# Patient Record
Sex: Male | Born: 1975 | Race: White | Hispanic: No | Smoking: Current every day smoker
Health system: Southern US, Community
[De-identification: ages and names within clinical notes are randomized; demographics above are authoritative.]

## PROBLEM LIST (undated history)

## (undated) DIAGNOSIS — N289 Disorder of kidney and ureter, unspecified: Secondary | ICD-10-CM

## (undated) DIAGNOSIS — K219 Gastro-esophageal reflux disease without esophagitis: Secondary | ICD-10-CM

## (undated) DIAGNOSIS — Z87442 Personal history of urinary calculi: Secondary | ICD-10-CM

## (undated) HISTORY — PX: HAND SURGERY: SHX662

## (undated) HISTORY — PX: VASECTOMY: SHX75

## (undated) HISTORY — PX: COLONOSCOPY: SHX174

## (undated) HISTORY — PX: WRIST FRACTURE SURGERY: SHX121

---

## 1998-02-02 ENCOUNTER — Emergency Department (HOSPITAL_COMMUNITY): Admission: EM | Admit: 1998-02-02 | Discharge: 1998-02-02 | Payer: Self-pay | Admitting: Emergency Medicine

## 1998-02-14 ENCOUNTER — Emergency Department (HOSPITAL_COMMUNITY): Admission: EM | Admit: 1998-02-14 | Discharge: 1998-02-14 | Payer: Self-pay | Admitting: Emergency Medicine

## 2014-12-31 ENCOUNTER — Emergency Department (HOSPITAL_COMMUNITY)
Admission: EM | Admit: 2014-12-31 | Discharge: 2014-12-31 | Disposition: A | Discharge status: Home / Self Care | Payer: Managed Care, Other (non HMO) | Attending: Emergency Medicine | Admitting: Emergency Medicine

## 2014-12-31 ENCOUNTER — Emergency Department (HOSPITAL_COMMUNITY): Payer: Managed Care, Other (non HMO)

## 2014-12-31 ENCOUNTER — Encounter (HOSPITAL_COMMUNITY): Payer: Self-pay | Admitting: Emergency Medicine

## 2014-12-31 DIAGNOSIS — Z79899 Other long term (current) drug therapy: Secondary | ICD-10-CM | POA: Diagnosis not present

## 2014-12-31 DIAGNOSIS — M238X2 Other internal derangements of left knee: Secondary | ICD-10-CM | POA: Insufficient documentation

## 2014-12-31 DIAGNOSIS — X58XXXA Exposure to other specified factors, initial encounter: Secondary | ICD-10-CM | POA: Insufficient documentation

## 2014-12-31 DIAGNOSIS — Y9289 Other specified places as the place of occurrence of the external cause: Secondary | ICD-10-CM | POA: Insufficient documentation

## 2014-12-31 DIAGNOSIS — Y998 Other external cause status: Secondary | ICD-10-CM | POA: Insufficient documentation

## 2014-12-31 DIAGNOSIS — Y9364 Activity, baseball: Secondary | ICD-10-CM | POA: Diagnosis not present

## 2014-12-31 DIAGNOSIS — M25562 Pain in left knee: Secondary | ICD-10-CM | POA: Diagnosis present

## 2014-12-31 DIAGNOSIS — M2392 Unspecified internal derangement of left knee: Secondary | ICD-10-CM

## 2014-12-31 DIAGNOSIS — Z87891 Personal history of nicotine dependence: Secondary | ICD-10-CM | POA: Diagnosis not present

## 2014-12-31 MED ORDER — HYDROCODONE-ACETAMINOPHEN 5-325 MG PO TABS: 1.0000 | ORAL_TABLET | ORAL | Status: DC | PRN

## 2014-12-31 MED ORDER — HYDROCODONE-ACETAMINOPHEN 5-325 MG PO TABS
1.0000 | ORAL_TABLET | Freq: Once | ORAL | Status: AC
Start: 2014-12-31 — End: 2014-12-31
  Administered 2014-12-31: 1 via ORAL
  Filled 2014-12-31: qty 1

## 2014-12-31 MED ORDER — IBUPROFEN 600 MG PO TABS: 600.0000 mg | ORAL_TABLET | Freq: Four times a day (QID) | ORAL | Status: DC | PRN

## 2014-12-31 NOTE — Discharge Instructions (Signed)
Knee Pain °The knee is the complex joint between your thigh and your lower leg. It is made up of bones, tendons, ligaments, and cartilage. The bones that make up the knee are: °· The femur in the thigh. °· The tibia and fibula in the lower leg. °· The patella or kneecap riding in the groove on the lower femur. °CAUSES  °Knee pain is a common complaint with many causes. A few of these causes are: °· Injury, such as: °¨ A ruptured ligament or tendon injury. °¨ Torn cartilage. °· Medical conditions, such as: °¨ Gout °¨ Arthritis °¨ Infections °· Overuse, over training, or overdoing a physical activity. °Knee pain can be minor or severe. Knee pain can accompany debilitating injury. Minor knee problems often respond well to self-care measures or get well on their own. More serious injuries may need medical intervention or even surgery. °SYMPTOMS °The knee is complex. Symptoms of knee problems can vary widely. Some of the problems are: °· Pain with movement and weight bearing. °· Swelling and tenderness. °· Buckling of the knee. °· Inability to straighten or extend your knee. °· Your knee locks and you cannot straighten it. °· Warmth and redness with pain and fever. °· Deformity or dislocation of the kneecap. °DIAGNOSIS  °Determining what is wrong may be very straight forward such as when there is an injury. It can also be challenging because of the complexity of the knee. Tests to make a diagnosis may include: °· Your caregiver taking a history and doing a physical exam. °· Routine X-rays can be used to rule out other problems. X-rays will not reveal a cartilage tear. Some injuries of the knee can be diagnosed by: °¨ Arthroscopy a surgical technique by which a small video camera is inserted through tiny incisions on the sides of the knee. This procedure is used to examine and repair internal knee joint problems. Tiny instruments can be used during arthroscopy to repair the torn knee cartilage (meniscus). °¨ Arthrography  is a radiology technique. A contrast liquid is directly injected into the knee joint. Internal structures of the knee joint then become visible on X-ray film. °¨ An MRI scan is a non X-ray radiology procedure in which magnetic fields and a computer produce two- or three-dimensional images of the inside of the knee. Cartilage tears are often visible using an MRI scanner. MRI scans have largely replaced arthrography in diagnosing cartilage tears of the knee. °· Blood work. °· Examination of the fluid that helps to lubricate the knee joint (synovial fluid). This is done by taking a sample out using a needle and a syringe. °TREATMENT °The treatment of knee problems depends on the cause. Some of these treatments are: °· Depending on the injury, proper casting, splinting, surgery, or physical therapy care will be needed. °· Give yourself adequate recovery time. Do not overuse your joints. If you begin to get sore during workout routines, back off. Slow down or do fewer repetitions. °· For repetitive activities such as cycling or running, maintain your strength and nutrition. °· Alternate muscle groups. For example, if you are a weight lifter, work the upper body on one day and the lower body the next. °· Either tight or weak muscles do not give the proper support for your knee. Tight or weak muscles do not absorb the stress placed on the knee joint. Keep the muscles surrounding the knee strong. °· Take care of mechanical problems. °¨ If you have flat feet, orthotics or special shoes may help.   See your caregiver if you need help. °¨ Arch supports, sometimes with wedges on the inner or outer aspect of the heel, can help. These can shift pressure away from the side of the knee most bothered by osteoarthritis. °¨ A brace called an "unloader" brace also may be used to help ease the pressure on the most arthritic side of the knee. °· If your caregiver has prescribed crutches, braces, wraps or ice, use as directed. The acronym  for this is PRICE. This means protection, rest, ice, compression, and elevation. °· Nonsteroidal anti-inflammatory drugs (NSAIDs), can help relieve pain. But if taken immediately after an injury, they may actually increase swelling. Take NSAIDs with food in your stomach. Stop them if you develop stomach problems. Do not take these if you have a history of ulcers, stomach pain, or bleeding from the bowel. Do not take without your caregiver's approval if you have problems with fluid retention, heart failure, or kidney problems. °· For ongoing knee problems, physical therapy may be helpful. °· Glucosamine and chondroitin are over-the-counter dietary supplements. Both may help relieve the pain of osteoarthritis in the knee. These medicines are different from the usual anti-inflammatory drugs. Glucosamine may decrease the rate of cartilage destruction. °· Injections of a corticosteroid drug into your knee joint may help reduce the symptoms of an arthritis flare-up. They may provide pain relief that lasts a few months. You may have to wait a few months between injections. The injections do have a small increased risk of infection, water retention, and elevated blood sugar levels. °· Hyaluronic acid injected into damaged joints may ease pain and provide lubrication. These injections may work by reducing inflammation. A series of shots may give relief for as long as 6 months. °· Topical painkillers. Applying certain ointments to your skin may help relieve the pain and stiffness of osteoarthritis. Ask your pharmacist for suggestions. Many over the-counter products are approved for temporary relief of arthritis pain. °· In some countries, doctors often prescribe topical NSAIDs for relief of chronic conditions such as arthritis and tendinitis. A review of treatment with NSAID creams found that they worked as well as oral medications but without the serious side effects. °PREVENTION °· Maintain a healthy weight. Extra pounds  put more strain on your joints. °· Get strong, stay limber. Weak muscles are a common cause of knee injuries. Stretching is important. Include flexibility exercises in your workouts. °· Be smart about exercise. If you have osteoarthritis, chronic knee pain or recurring injuries, you may need to change the way you exercise. This does not mean you have to stop being active. If your knees ache after jogging or playing basketball, consider switching to swimming, water aerobics, or other low-impact activities, at least for a few days a week. Sometimes limiting high-impact activities will provide relief. °· Make sure your shoes fit well. Choose footwear that is right for your sport. °· Protect your knees. Use the proper gear for knee-sensitive activities. Use kneepads when playing volleyball or laying carpet. Buckle your seat belt every time you drive. Most shattered kneecaps occur in car accidents. °· Rest when you are tired. °SEEK MEDICAL CARE IF:  °You have knee pain that is continual and does not seem to be getting better.  °SEEK IMMEDIATE MEDICAL CARE IF:  °Your knee joint feels hot to the touch and you have a high fever. °MAKE SURE YOU:  °· Understand these instructions. °· Will watch your condition. °· Will get help right away if you are not   doing well or get worse. Document Released: 06/16/2007 Document Revised: 11/11/2011 Document Reviewed: 06/16/2007 [facility] Patient Information 2015 [place], [place]. This information is not intended to replace advice given to you by your health care provider. Make sure you discuss any questions you have with your health care provider.   As discussed,  I suspect you may have an anterior cruciate ligament injury, but possibly not a complete tear as your knee joint appears stable at this time.  You do need to see your orthopedist for further evaluation of this injury.  Use crutches and the knee immobilizer to protect your knee.  Ice and elevate as much as possible for the next  several days to help with pain and swelling.  You may take the hydrocodone prescribed for pain relief.  This will make you drowsy - do not drive within 4 hours of taking this medication.

## 2014-12-31 NOTE — ED Notes (Signed)
Patient complaining of left knee pain. States knee buckled twice while playing softball. States is able to straighten knee out, but hurts to bear weight.

## 2015-01-01 NOTE — ED Provider Notes (Signed)
CSN: [id number]     Arrival date & time 12/31/14  2048 History   First MD Initiated Contact with Patient 12/31/14 2111     Chief Complaint  Patient presents with  . Knee Injury     (Consider location/radiation/quality/duration/timing/severity/associated sxs/prior Treatment) The history is provided by the patient and the spouse.   TARQUIN WELCHER is a 39 y.o. male presenting with left knee pain which started while playing softball around noon today. He describes planting his left foot to throw the ball and when he pivoted felt a sudden sharp pain in the knee joint causing him to fall.  Since he has been minimizing weight bearing and using ice without significant improvement in the pain which is worsened with movement and weight bearing.  Denies prior injury to this knee.  He is actively being followed by an orthopedist in [place] for a right hand surgical metacarpal repair.     History reviewed. No pertinent past medical history. Past Surgical History  Procedure Laterality Date  . Hand surgery     History reviewed. No pertinent family history. History  Substance Use Topics  . Smoking status: Former Games developer  . Smokeless tobacco: Not on file  . Alcohol Use: Yes     Comment: occasional    Review of Systems  Constitutional: Negative for fever.  Musculoskeletal: Positive for joint swelling and arthralgias. Negative for myalgias.  Neurological: Negative for weakness and numbness.      Allergies  Review of patient's allergies indicates no known allergies.  Home Medications   Prior to Admission medications   Medication Sig Start Date End Date Taking? Authorizing Provider  L-LYSINE PO Take 500 mg by mouth 2 (two) times daily.   Yes Historical Provider, MD  HYDROcodone-acetaminophen (NORCO/VICODIN) 5-325 MG per tablet Take 1 tablet by mouth every 4 (four) hours as needed. 12/31/14   Burgess Amor, PA-C  ibuprofen (ADVIL,MOTRIN) 600 MG tablet Take 1 tablet (600 mg total) by mouth  every 6 (six) hours as needed. 12/31/14   Burgess Amor, PA-C   BP 121/71 mmHg  Pulse 77  Temp(Src) 98 F (36.7 C) (Oral)  Resp 20  Ht 6' (1.829 m)  Wt 225 lb (102.059 kg)  BMI 30.51 kg/m2  SpO2 94% Physical Exam  Constitutional: He appears well-developed and well-nourished.  HENT:  Head: Atraumatic.  Neck: Normal range of motion.  Cardiovascular:  Pulses equal bilaterally  Musculoskeletal: He exhibits tenderness.       Left knee: He exhibits swelling and bony tenderness. He exhibits no effusion, no ecchymosis, no deformity, no erythema, normal alignment, no LCL laxity and no MCL laxity.  ttp anterior medial and lateral  joint space along tibial meniscus.  Negative anterior/posterior drawer tests, although pain increased with anterior drawer.  Neurological: He is alert. He has normal strength. He displays normal reflexes. No sensory deficit.  Skin: Skin is warm and dry.  Psychiatric: He has a normal mood and affect.    ED Course  Procedures (including critical care time) Labs Review Labs Reviewed - No data to display  Imaging Review Dg Knee Complete 4 Views Left  12/31/2014   CLINICAL DATA:  Acute onset of left knee pain. Knee buckled twice while playing softball. Initial encounter.  EXAM: LEFT KNEE - COMPLETE 4+ VIEW  COMPARISON:  None.  FINDINGS: There is question of a tiny osseous fragment overlying the tibial spine. Would correlate as to whether the patient has evidence of anterior cruciate ligament avulsion injury.  There is no  additional evidence for fracture. The joint spaces are otherwise preserved. No significant degenerative change is seen; the patellofemoral joint is grossly unremarkable in appearance.  No significant joint effusion is seen. The visualized soft tissues are normal in appearance.  IMPRESSION: Question of tiny osseous fragment overlying the tibial spine. Would correlate as to whether the patient has evidence of anterior cruciate ligament avulsion injury. If  instability persists, MRI may be considered for further evaluation, to assess for internal derangement.   Electronically Signed   By: Roanna RaiderJeffery  Chang M.D.   On: 12/31/2014 21:35     EKG Interpretation None      MDM   Final diagnoses:  Knee internal derangement, left    Patients labs and/or radiological studies were reviewed and considered during the medical decision making and disposition process.  Results were also discussed with patient. Pt may have sprain/partial tear to ACL, negative drawer testing.  He was placed in knee immobilizer, crutches given. RICE, ibuprofen, hydrocodone. Pt to f/u with his orthopedist this week for recheck/further testing as indicated.  He was given xrays on cd for his orthopedist.    Burgess AmorJulie Emillia Weatherly, PA-C 01/01/15 1250  Donnetta HutchingBrian Cook, MD 01/01/15 1517

## 2015-01-03 ENCOUNTER — Ambulatory Visit (INDEPENDENT_AMBULATORY_CARE_PROVIDER_SITE_OTHER): Payer: Managed Care, Other (non HMO) | Admitting: Orthopedic Surgery

## 2015-01-03 ENCOUNTER — Encounter: Payer: Self-pay | Admitting: Orthopedic Surgery

## 2015-01-03 VITALS — BP 141/84 | Ht 72.0 in | Wt 225.0 lb

## 2015-01-03 DIAGNOSIS — S83242A Other tear of medial meniscus, current injury, left knee, initial encounter: Secondary | ICD-10-CM | POA: Diagnosis not present

## 2015-01-03 DIAGNOSIS — S83512A Sprain of anterior cruciate ligament of left knee, initial encounter: Secondary | ICD-10-CM

## 2015-01-03 MED ORDER — HYDROCODONE-ACETAMINOPHEN 5-325 MG PO TABS: 1.0000 | ORAL_TABLET | Freq: Four times a day (QID) | ORAL | Status: DC | PRN

## 2015-01-03 MED ORDER — NABUMETONE 500 MG PO TABS: 500.0000 mg | ORAL_TABLET | Freq: Two times a day (BID) | ORAL | Status: DC

## 2015-01-03 NOTE — Patient Instructions (Signed)
We will schedule MRI for you and call you with results 

## 2015-01-03 NOTE — Progress Notes (Signed)
New patient  Patient ID: Adam Norris, male   DOB: [birth year], 39 y.o.   MRN: [id number]  Chief Complaint  Patient presents with  . Knee Pain    er follow up left knee pain, DOI 12/31/14     Data Reviewed The ER records and [facility] were [facility]. The patient's 4 views of his knee were read as no acute fracture but possible avulsion fragment in the central part of the joint I agree with that report after my independent review. ER records were reviewed and the patient confirms what was said at that time basically he injured his knee playing softball and had trouble walking the knee buckled on him.  Assessment New problem further workup planned Encounter Diagnoses  Name Primary?  . ACL tear, left, initial encounter Yes  . Acute medial meniscus tear of left knee, initial encounter    Plan Recommend MRI left knee to evaluate his anterior cruciate ligament and medial meniscus Start Relafen 500 mg twice a day to control his pain and swelling Continue prescription written for hydrocodone 5 mg every 6 when necessary Remove knee immobilizer switched to economy hinged brace wrap on style    Adam SciaraJoseph B Norris is a 39 y.o. male.   HPI 39 year old male was playing softball went to throw the ball planted his left foot something gave in his knee when he went to put his foot down again he collapsed as the knee would not support weight. He went to the ER x-rays were negative. He complains of pain swelling left knee primarily medial joint line with decreased range of motion, pain is now constant and worsened by weightbearing and flexion Review of Systems 14 point system review normal   History reviewed. No pertinent past medical history.  Past Surgical History  Procedure Laterality Date  . Hand surgery      History reviewed. No pertinent family history.  Social History History  Substance Use Topics  . Smoking status: Former Games developermoker  . Smokeless tobacco: Not on file   . Alcohol Use: Yes     Comment: occasional    No Known Allergies  Current Outpatient Prescriptions  Medication Sig Dispense Refill  . HYDROcodone-acetaminophen (NORCO/VICODIN) 5-325 MG per tablet Take 1 tablet by mouth every 4 (four) hours as needed. 20 tablet 0  . ibuprofen (ADVIL,MOTRIN) 600 MG tablet Take 1 tablet (600 mg total) by mouth every 6 (six) hours as needed. 30 tablet 0  . L-LYSINE PO Take 500 mg by mouth 2 (two) times daily.    Marland Kitchen. HYDROcodone-acetaminophen (NORCO/VICODIN) 5-325 MG per tablet Take 1 tablet by mouth every 6 (six) hours as needed for moderate pain. 30 tablet 0  . nabumetone (RELAFEN) 500 MG tablet Take 1 tablet (500 mg total) by mouth 2 (two) times daily. 60 tablet 5   No current facility-administered medications for this visit.       Physical Exam Blood pressure 141/84, height 6' (1.829 m), weight 225 lb (102.059 kg). Physical Exam  Musculoskeletal:       Right knee: He exhibits no effusion.       Left knee: He exhibits effusion.   The patient is well developed well nourished and well groomed. Orientation to person place and time is normal  Mood is pleasant. Ambulatory status  , ambulating with a crutch and a straight leg knee immobilizer His upper extremities look normal he's able to ambulate with crutches there is no evidence of malalignment joint contracture subluxation  atrophy. Skin normal. 2+ radial pulses bilaterally. Right Knee Exam  Right knee exam is normal.  Tenderness  The patient is experiencing no tenderness.     Range of Motion  The patient has normal right knee ROM.  Muscle Strength   The patient has normal right knee strength.  Tests  McMurray:  Medial - negative Lateral - negative Lachman:  Anterior - negative    Posterior - negative Drawer:       Posterior - negative Varus: negative Valgus: negative Patellar Apprehension: negative  Other  Erythema: absent Scars: absent Sensation: normal Pulse: present Swelling:  none Other tests: no effusion present  Comments:  Motor exam is normal   Left Knee Exam   Tenderness  The patient is experiencing tenderness in the medial joint line.  Range of Motion  Extension: 10  Flexion: 90   Tests  McMurray:  Medial - positive  Lachman:  Anterior - trace     Drawer:       Posterior - negative Patellar Apprehension: negative  Other  Erythema: absent Scars: absent Sensation: normal Pulse: present Swelling: moderate Effusion: effusion present  Comments:  Motor exam is normal

## 2015-01-16 ENCOUNTER — Other Ambulatory Visit: Payer: Self-pay | Admitting: *Deleted

## 2015-01-16 ENCOUNTER — Telehealth: Payer: Self-pay | Admitting: Orthopedic Surgery

## 2015-01-16 MED ORDER — HYDROCODONE-ACETAMINOPHEN 5-325 MG PO TABS: 1.0000 | ORAL_TABLET | Freq: Four times a day (QID) | ORAL | Status: DC | PRN

## 2015-01-16 NOTE — Telephone Encounter (Signed)
Patient is calling requesting a refill on pain medication HYDROcodone-acetaminophen (NORCO/VICODIN) 5-325 MG per tablet he states that his MRI is scheduled for Tuesday May 17 th at 6:45, please advise?

## 2015-01-16 NOTE — Telephone Encounter (Signed)
Prescription available, called patient, no answer 

## 2015-01-17 ENCOUNTER — Other Ambulatory Visit: Payer: Self-pay | Admitting: Orthopedic Surgery

## 2015-01-17 ENCOUNTER — Ambulatory Visit (HOSPITAL_COMMUNITY)
Admission: RE | Admit: 2015-01-17 | Discharge: 2015-01-17 | Disposition: A | Discharge status: Home / Self Care | Payer: Managed Care, Other (non HMO) | Source: Ambulatory Visit | Attending: Orthopedic Surgery | Admitting: Orthopedic Surgery

## 2015-01-17 DIAGNOSIS — M25562 Pain in left knee: Secondary | ICD-10-CM | POA: Insufficient documentation

## 2015-01-17 DIAGNOSIS — X58XXXA Exposure to other specified factors, initial encounter: Secondary | ICD-10-CM | POA: Insufficient documentation

## 2015-01-17 DIAGNOSIS — S72422A Displaced fracture of lateral condyle of left femur, initial encounter for closed fracture: Secondary | ICD-10-CM | POA: Insufficient documentation

## 2015-01-17 DIAGNOSIS — S83512A Sprain of anterior cruciate ligament of left knee, initial encounter: Secondary | ICD-10-CM

## 2015-01-17 DIAGNOSIS — Y9364 Activity, baseball: Secondary | ICD-10-CM | POA: Insufficient documentation

## 2015-01-17 DIAGNOSIS — Y929 Unspecified place or not applicable: Secondary | ICD-10-CM | POA: Insufficient documentation

## 2015-01-17 DIAGNOSIS — S82142A Displaced bicondylar fracture of left tibia, initial encounter for closed fracture: Secondary | ICD-10-CM | POA: Insufficient documentation

## 2015-01-18 ENCOUNTER — Telehealth: Payer: Self-pay | Admitting: Orthopedic Surgery

## 2015-01-18 NOTE — Telephone Encounter (Signed)
-----   Message from Vickki HearingStanley E Shayaan Parke, MD sent at 01/18/2015 11:15 AM EDT ----- results

## 2015-01-19 NOTE — Telephone Encounter (Signed)
Called results to him [phone]

## 2015-03-02 ENCOUNTER — Ambulatory Visit (INDEPENDENT_AMBULATORY_CARE_PROVIDER_SITE_OTHER): Payer: BLUE CROSS/BLUE SHIELD | Admitting: Orthopedic Surgery

## 2015-03-02 ENCOUNTER — Encounter: Payer: Self-pay | Admitting: Orthopedic Surgery

## 2015-03-02 ENCOUNTER — Ambulatory Visit (INDEPENDENT_AMBULATORY_CARE_PROVIDER_SITE_OTHER): Payer: BLUE CROSS/BLUE SHIELD

## 2015-03-02 VITALS — BP 118/71 | Ht 71.0 in | Wt 228.0 lb

## 2015-03-02 DIAGNOSIS — S82142D Displaced bicondylar fracture of left tibia, subsequent encounter for closed fracture with routine healing: Secondary | ICD-10-CM

## 2015-03-02 DIAGNOSIS — S82143A Displaced bicondylar fracture of unspecified tibia, initial encounter for closed fracture: Secondary | ICD-10-CM | POA: Insufficient documentation

## 2015-03-02 NOTE — Patient Instructions (Signed)
Continue brace until pain free

## 2015-03-02 NOTE — Progress Notes (Signed)
Patient ID: Adam Norris, male   DOB: [birth year], 39 y.o.   MRN: [id number]  Follow up visit  Chief Complaint  Patient presents with  . Follow-up    3 week follow up left tibial plateau fracture, DOI 12/31/14    BP 118/71 mmHg  Ht 5\' 11"  (1.803 m)  Wt 228 lb (103.42 kg)  BMI 31.81 kg/m2  Encounter Diagnosis  Name Primary?  . Tibial plateau fracture, left, closed, with routine healing, subsequent encounter Yes    Today the patient has just mild discomfort. He is wearing his hinged knee brace. He's been able to walk without too much of a problem maybe an occasional twinge of pain  Review of systems no catching locking giving way or mechanical symptoms in the knee  He has no tenderness in the knee except just right at the edge of the tibial plateau he has full range of motion without effusion. His anterior cruciate ligament was intact and stable. Collateral ligaments are normal. Motor exam is normal and his skin and sensory exam are unremarkable  The x-rays today that I ordered show no evidence of fracture  The MRI was reviewed again and it shows impaction fractures  He can resume normal activities and wear his brace until he is completely pain-free and no follow-up has been scheduled

## 2015-11-03 ENCOUNTER — Emergency Department (HOSPITAL_COMMUNITY)
Admission: EM | Admit: 2015-11-03 | Discharge: 2015-11-03 | Disposition: A | Discharge status: Home / Self Care | Payer: BLUE CROSS/BLUE SHIELD | Attending: Emergency Medicine | Admitting: Emergency Medicine

## 2015-11-03 ENCOUNTER — Encounter (HOSPITAL_COMMUNITY): Payer: Self-pay | Admitting: Emergency Medicine

## 2015-11-03 ENCOUNTER — Emergency Department (HOSPITAL_COMMUNITY): Payer: BLUE CROSS/BLUE SHIELD

## 2015-11-03 DIAGNOSIS — R202 Paresthesia of skin: Secondary | ICD-10-CM

## 2015-11-03 DIAGNOSIS — M79652 Pain in left thigh: Secondary | ICD-10-CM | POA: Diagnosis present

## 2015-11-03 DIAGNOSIS — Z87442 Personal history of urinary calculi: Secondary | ICD-10-CM | POA: Diagnosis not present

## 2015-11-03 DIAGNOSIS — Z87891 Personal history of nicotine dependence: Secondary | ICD-10-CM | POA: Insufficient documentation

## 2015-11-03 DIAGNOSIS — Z79899 Other long term (current) drug therapy: Secondary | ICD-10-CM | POA: Insufficient documentation

## 2015-11-03 DIAGNOSIS — Z8781 Personal history of (healed) traumatic fracture: Secondary | ICD-10-CM | POA: Insufficient documentation

## 2015-11-03 HISTORY — DX: Disorder of kidney and ureter, unspecified: N28.9

## 2015-11-03 MED ORDER — DEXAMETHASONE 4 MG PO TABS: 4.0000 mg | ORAL_TABLET | Freq: Two times a day (BID) | ORAL | Status: DC

## 2015-11-03 MED ORDER — HYDROCODONE-ACETAMINOPHEN 5-325 MG PO TABS: 1.0000 | ORAL_TABLET | ORAL | Status: DC | PRN

## 2015-11-03 MED ORDER — DICLOFENAC SODIUM 75 MG PO TBEC: 75.0000 mg | DELAYED_RELEASE_TABLET | Freq: Two times a day (BID) | ORAL | Status: DC

## 2015-11-03 NOTE — ED Provider Notes (Signed)
Medical screening examination/treatment/procedure(s) were performed by non-physician practitioner and as supervising physician I was immediately available for consultation/collaboration.   EKG Interpretation None       Vanetta MuldersScott Eliannah Hinde, MD 11/03/15 2031

## 2015-11-03 NOTE — Discharge Instructions (Signed)
The x-ray of your pelvis and the x-ray of your lower back are nonacute. Please see Dr. Luiz BlareGraves for orthopedic evaluation concerning the pain and numbness involving her thigh. Please use Decadron and diclofenac on 2 times daily with food. May use Norco for more severe pain. Norco may cause drowsiness, please use this medication with caution. Paresthesia Paresthesia is a burning or prickling feeling. This feeling can happen in any part of the body. It often happens in the hands, arms, legs, or feet. Usually, it is not painful. In most cases, the feeling goes away in a short time and is not a sign of a serious problem. HOME CARE  Avoid drinking alcohol.  Try massage or needle therapy (acupuncture) to help with your problems.  Keep all follow-up visits as told by your doctor. This is important. GET HELP IF:  You keep on having episodes of paresthesia.  Your burning or prickling feeling gets worse when you walk.  You have pain or cramps.  You feel dizzy.  You have a rash. GET HELP RIGHT AWAY IF:  You feel weak.  You have trouble walking or moving.  You have problems speaking, understanding, or seeing.  You feel confused.  You cannot control when you pee (urinate) or poop (bowel movement).  You lose feeling (numbness) after an injury.  You pass out (faint).   This information is not intended to replace advice given to you by your health care provider. Make sure you discuss any questions you have with your health care provider.   Document Released: 08/01/2008 Document Revised: 01/03/2015 Document Reviewed: 08/15/2014 Elsevier Interactive Patient Education [employer].

## 2015-11-03 NOTE — ED Notes (Signed)
Pt reports on-going upper LT leg pain that has progressively increased over the past few months. Pt also reports intermittent back pain. Denies urinary issues or stool incontinence. Pt had a LT femur fracture last year. Pt ambulatory.

## 2015-11-03 NOTE — ED Notes (Signed)
Pt verbalized understanding of no driving and to use caution within 4 hours of taking pain meds due to meds cause drowsiness 

## 2015-11-03 NOTE — ED Provider Notes (Signed)
CSN: [id number]     Arrival date & time 11/03/15  1829 History   First MD Initiated Contact with Patient 11/03/15 1843     Chief Complaint  Patient presents with  . Leg Pain     (Consider location/radiation/quality/duration/timing/severity/associated sxs/prior Treatment) HPI Comments: Patient is a 40 year old male who presents to the emergency department with complaint of left thigh pain. The patient states that over the last 1-1/2-2 months he's been having increasing problems with pain in the left thigh area. He states that it started as a numbness or tingling, and now he has pain. The pain is aggravated by change of position. But once he gets in a position the pain sometimes improves. He denies any recent injury or trauma to the area. He has not done any excessive lifting, pushing, or pulling. He denies carrying a cell phone heavy belts or any other objects and is left pockets. He has not had any recent viral illnesses, and has not noticed any rash in this area. It is of note that last year he sustained a nondisplaced fracture of the left femur. Several years ago he fell approximately 30 feet and injured ribs, upper extremities. No other injuries reported at this time. The patient denies any significant back pain. He's not had any loss of bowel or bladder function. He's not had any noted foot drop. He does not have any symptoms related to the right lower extremity.   Patient is a 40 y.o. male presenting with leg pain. The history is provided by the patient.  Leg Pain   Past Medical History  Diagnosis Date  . Renal disorder     kidney stone   Past Surgical History  Procedure Laterality Date  . Hand surgery    . Wrist fracture surgery    . Vasectomy    . Colonoscopy     No family history on file. Social History  Substance Use Topics  . Smoking status: Former Games developermoker  . Smokeless tobacco: None  . Alcohol Use: Yes     Comment: occasional    Review of Systems  Musculoskeletal:  Positive for arthralgias.  Neurological: Positive for numbness.  All other systems reviewed and are negative.     Allergies  Review of patient's allergies indicates no known allergies.  Home Medications   Prior to Admission medications   Medication Sig Start Date End Date Taking? Authorizing Provider  HYDROcodone-acetaminophen (NORCO/VICODIN) 5-325 MG per tablet Take 1 tablet by mouth every 4 (four) hours as needed. Patient not taking: Reported on 03/02/2015 12/31/14   Burgess AmorJulie Idol, PA-C  HYDROcodone-acetaminophen (NORCO/VICODIN) 5-325 MG per tablet Take 1 tablet by mouth every 6 (six) hours as needed for moderate pain. 01/16/15   Vickki HearingStanley E Harrison, MD  ibuprofen (ADVIL,MOTRIN) 600 MG tablet Take 1 tablet (600 mg total) by mouth every 6 (six) hours as needed. 12/31/14   Burgess AmorJulie Idol, PA-C  L-LYSINE PO Take 500 mg by mouth 2 (two) times daily.    Historical Provider, MD  nabumetone (RELAFEN) 500 MG tablet Take 1 tablet (500 mg total) by mouth 2 (two) times daily. Patient not taking: Reported on 03/02/2015 01/03/15   Vickki HearingStanley E Harrison, MD   BP 147/89 mmHg  Pulse 99  Temp(Src) 98.4 F (36.9 C) (Oral)  Resp 20  Ht 5\' 8"  (1.727 m)  Wt 95.255 kg  BMI 31.94 kg/m2  SpO2 99% Physical Exam  Constitutional: He is oriented to person, place, and time. He appears well-developed and well-nourished.  Non-toxic appearance.  HENT:  Head: Normocephalic.  Right Ear: Tympanic membrane and external ear normal.  Left Ear: Tympanic membrane and external ear normal.  Eyes: EOM and lids are normal. Pupils are equal, round, and reactive to light.  Neck: Normal range of motion. Neck supple. Carotid bruit is not present.  Cardiovascular: Normal rate, regular rhythm, normal heart sounds, intact distal pulses and normal pulses.   Pulmonary/Chest: Breath sounds normal. No respiratory distress.  Abdominal: Soft. Bowel sounds are normal. There is no tenderness. There is no guarding.  Musculoskeletal: Normal range of  motion.       Lumbar back: He exhibits normal range of motion, no tenderness, no bony tenderness and no deformity.       Left upper leg: He exhibits tenderness. He exhibits no swelling, no edema and no deformity.       Legs: No pain with rocking the pelvis. Pain improved with lying on the left side.  Lymphadenopathy:       Head (right side): No submandibular adenopathy present.       Head (left side): No submandibular adenopathy present.    He has no cervical adenopathy.  Neurological: He is alert and oriented to person, place, and time. He has normal strength. No cranial nerve deficit or sensory deficit.  Skin: Skin is warm and dry.  Psychiatric: He has a normal mood and affect. His speech is normal.  Nursing note and vitals reviewed.   ED Course  Procedures (including critical care time) Labs Review Labs Reviewed - No data to display  Imaging Review Dg Lumbar Spine Complete  11/03/2015  CLINICAL DATA:  Left back and thigh pain tingling for several months. No known injury. EXAM: LUMBAR SPINE - COMPLETE 4+ VIEW COMPARISON:  None. FINDINGS: There is no evidence of lumbar spine fracture. Alignment is normal. Intervertebral disc spaces are maintained. Probable right L5 pars defect noted although no definite pars defects seen on the left posterior oblique view. No other bone lesions identified. IMPRESSION: No acute findings.  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 Electronically Signed   By: Myles Rosenthal M.D.   On: 11/03/2015 19:34   Dg Pelvis 1-2 Views  11/03/2015  CLINICAL DATA:  Left lateral thigh pain with tingling. EXAM: PELVIS - 1-2 VIEW COMPARISON:  None. FINDINGS: There is no evidence of pelvic fracture or diastasis. No pelvic bone lesions are seen. IMPRESSION: Negative. Electronically Signed   By: Amie Portland M.D.   On: 11/03/2015 19:34   I have personally reviewed and evaluated these images and lab results as part of my medical decision-making.   EKG Interpretation None       MDM  The patient presents to the emergency department with pain and tingling with increased sensitivity of the left upper and lateral thigh. X-ray of the lumbar spine is negative, with exception of probable unilateral right L5 pars defect. X-ray of the pelvis is negative for fracture or dislocation involving the pelvic bones. Patient will be referred to orthopedics for evaluation of possible femoral nerve irritation, and or entrapment. Prescription for Decadron and Norco given to the patient. Patient is referred to Dr. Luiz Blare for orthopedic evaluation.    Final diagnoses:  None    **I have reviewed nursing notes, vital signs, and all appropriate lab and imaging results for this patient.Ivery Quale, PA-C 11/03/15 1950  Ivery Quale, PA-C 11/04/15 [phone]

## 2016-08-17 IMAGING — DX DG KNEE COMPLETE 4+V*L*
4 series · 4 of 4 positions shown · non-contrast
Comparison: None.

CLINICAL DATA: Acute onset of left knee pain. Knee buckled twice
while playing softball. Initial encounter.

EXAM:
LEFT KNEE - COMPLETE 4+ VIEW

[knee ap]
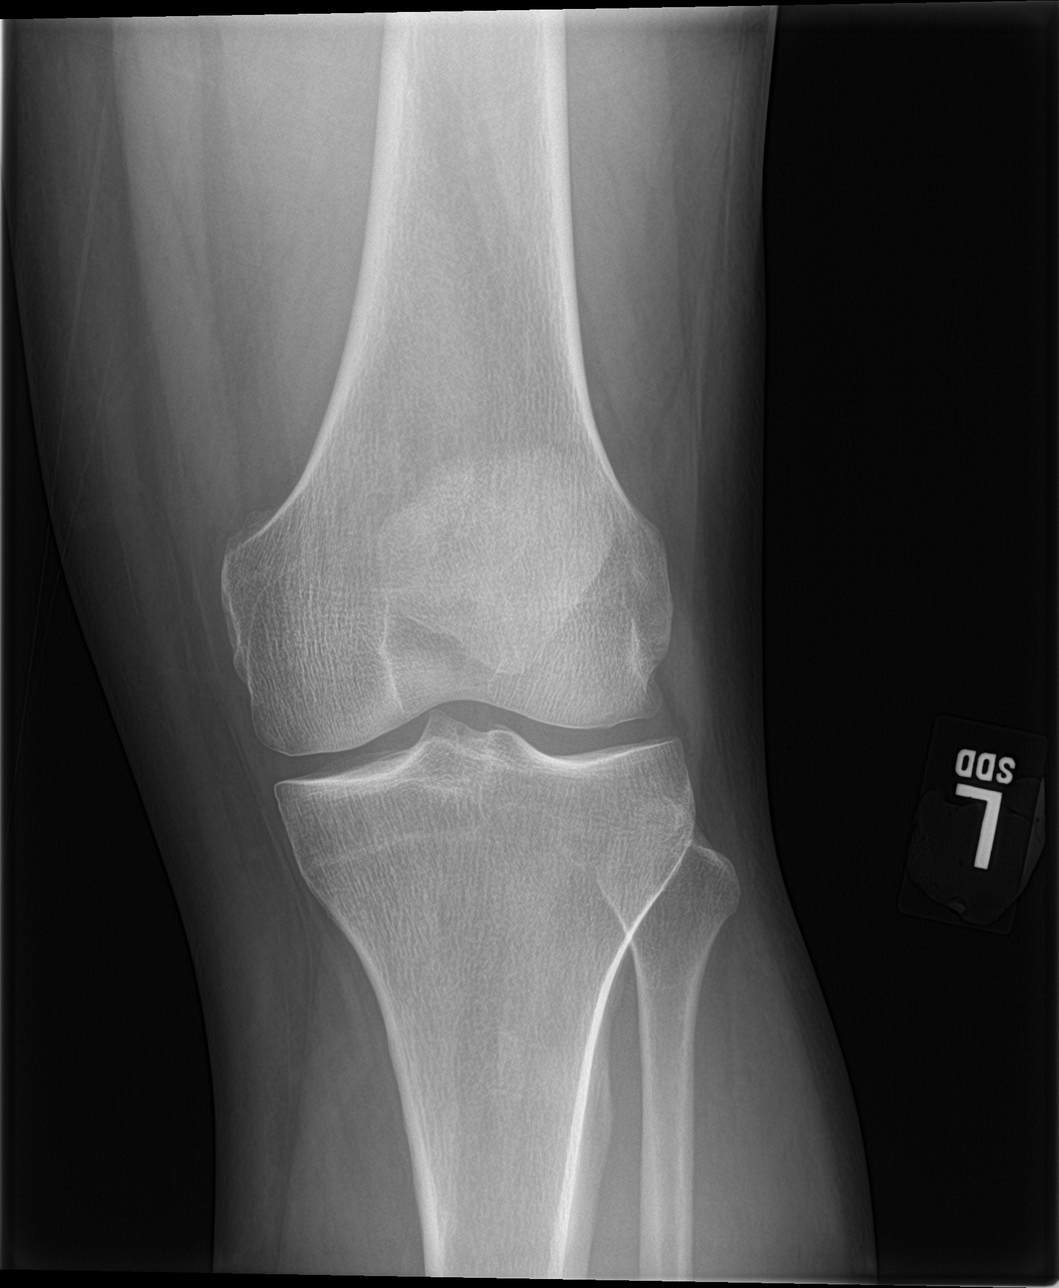

[knee obl (1 of 2)]
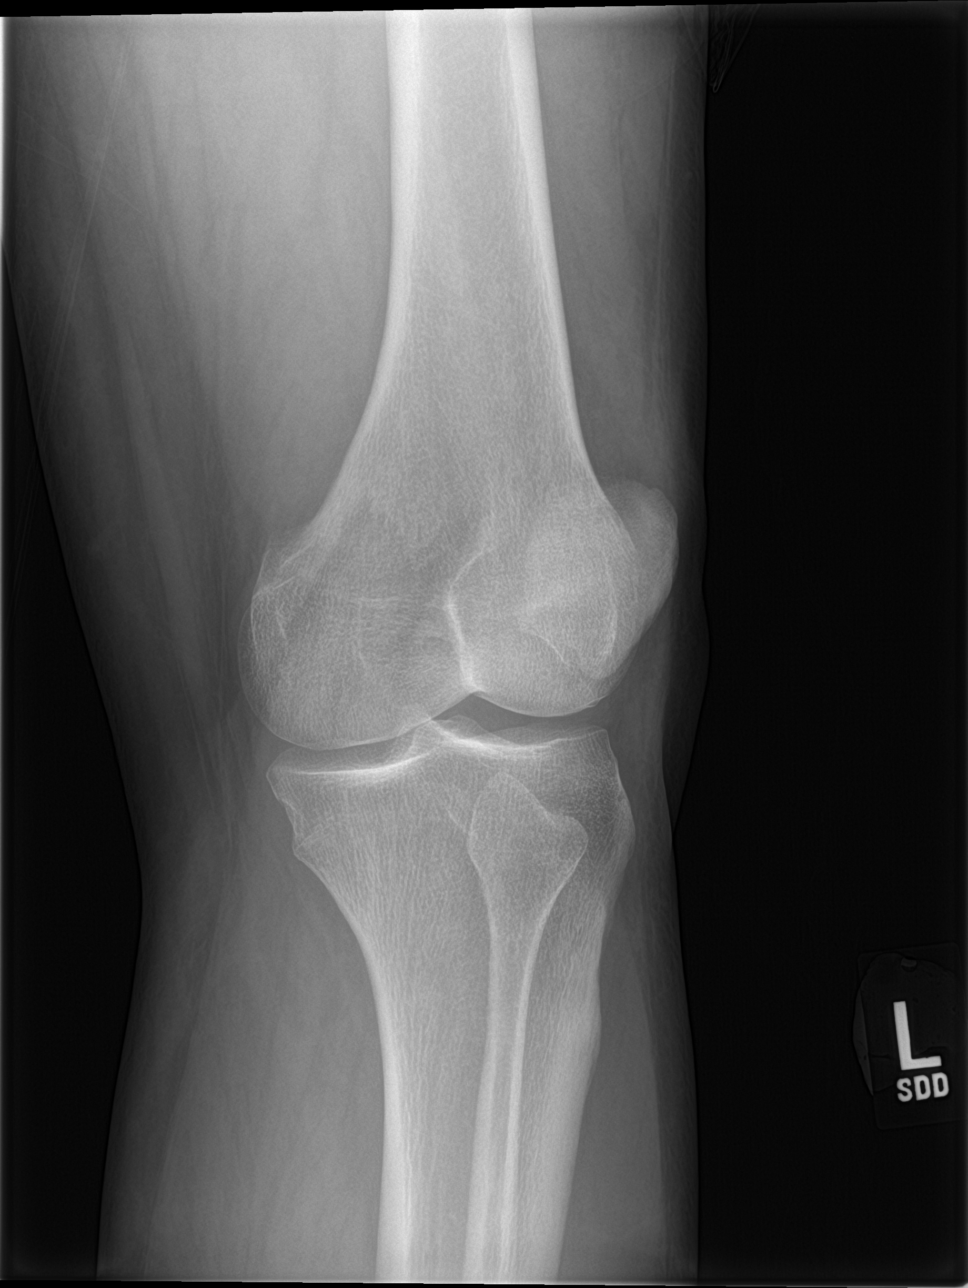

[knee obl (2 of 2)]
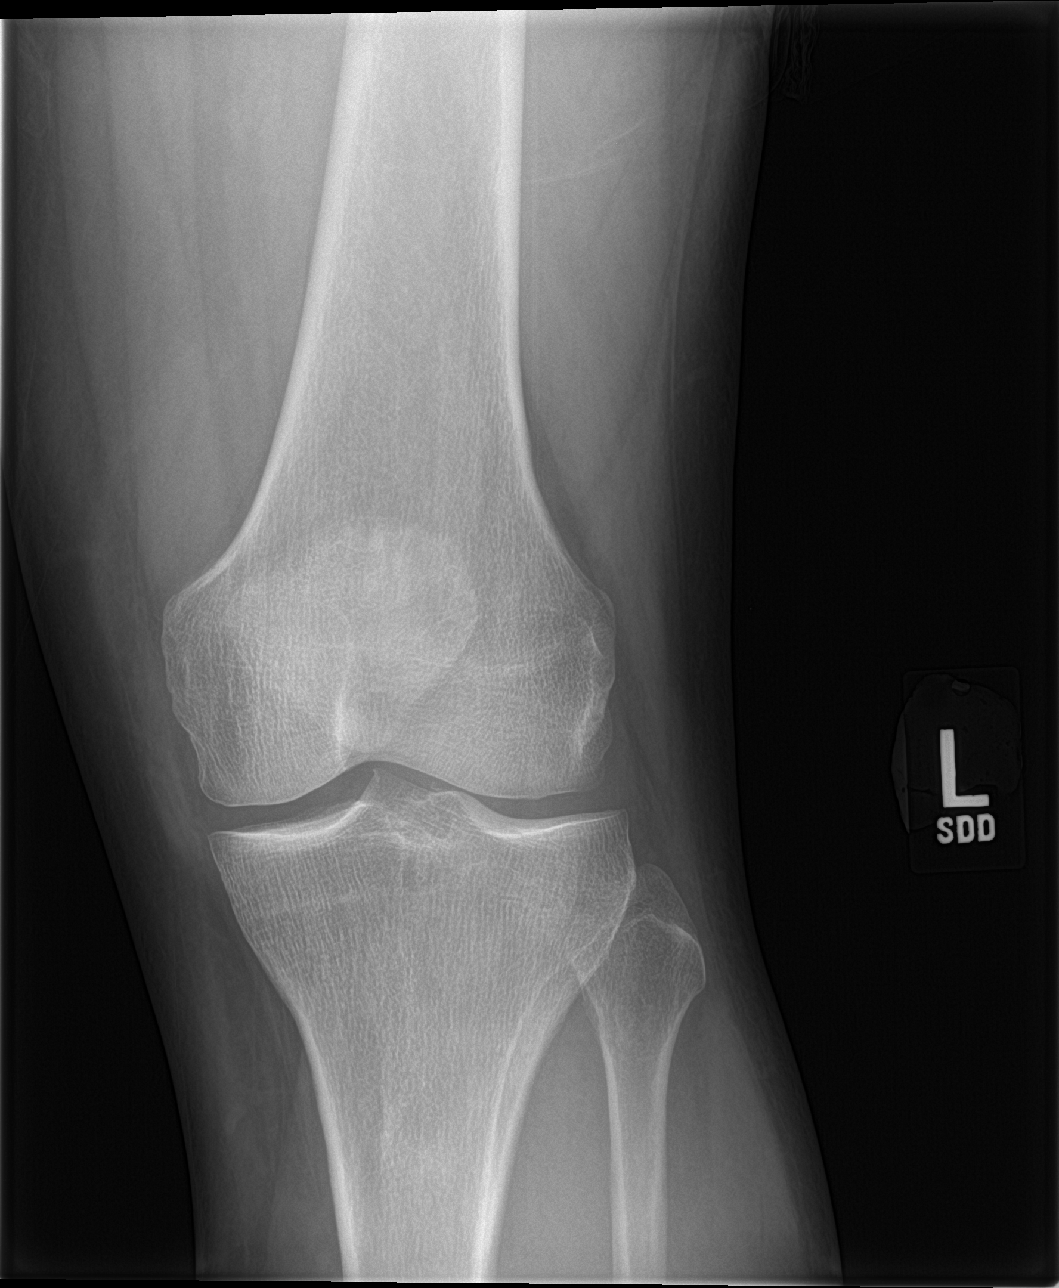

[knee lat]
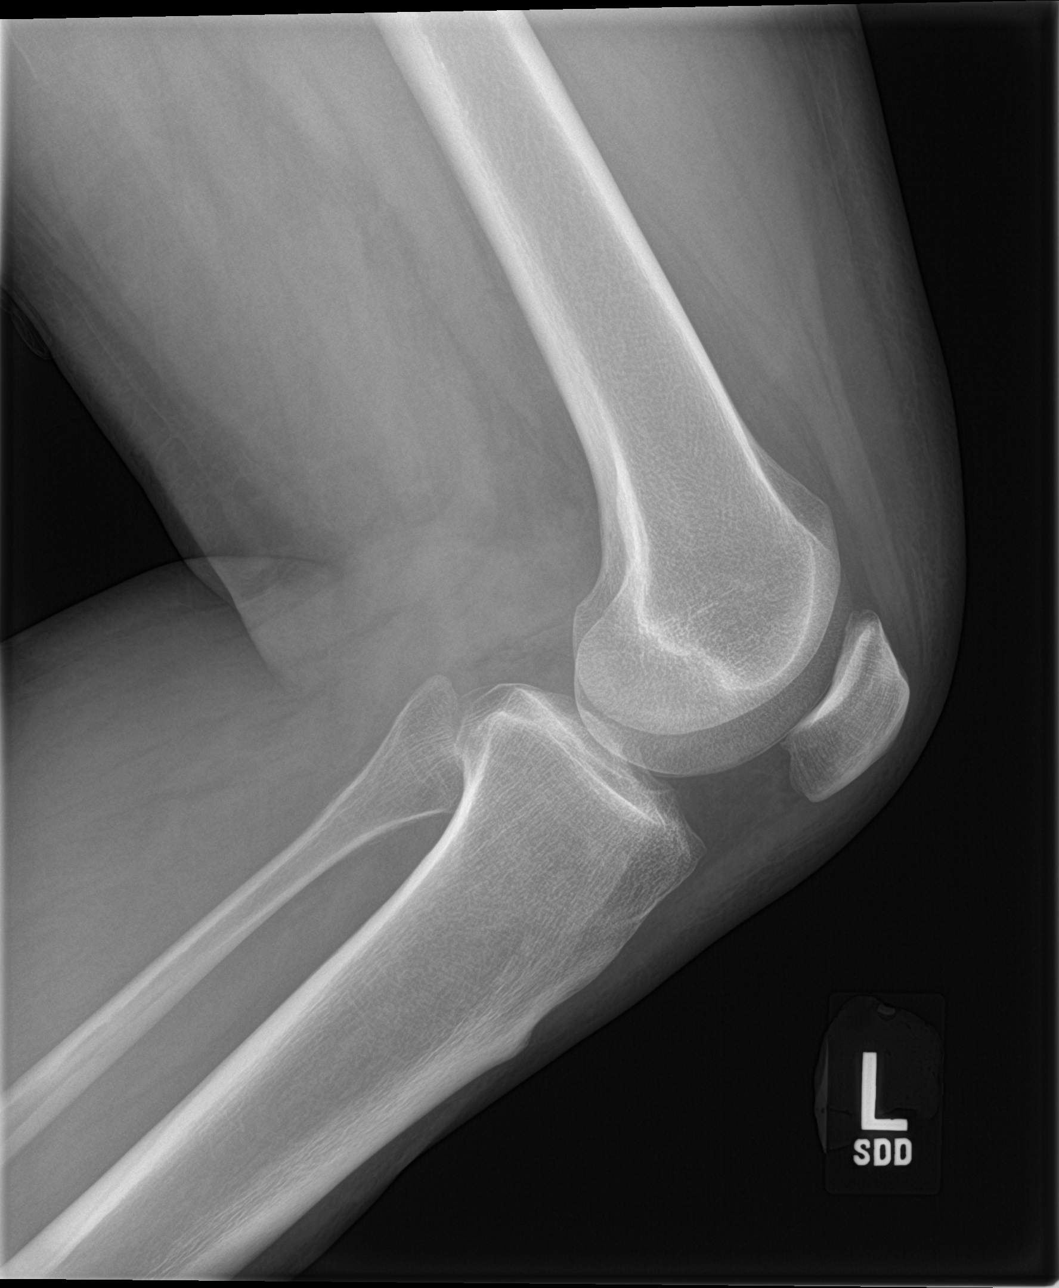

[4 of 4 positions shown; findings below may reference images not displayed]

FINDINGS: There is question of a tiny osseous fragment overlying the tibial
spine. Would correlate as to whether the patient has evidence of
anterior cruciate ligament avulsion injury.

There is no additional evidence for fracture. The joint spaces are
otherwise preserved. No significant degenerative change is seen; the
patellofemoral joint is grossly unremarkable in appearance.

No significant joint effusion is seen. The visualized soft tissues
are normal in appearance.
IMPRESSION: Question of tiny osseous fragment overlying the tibial spine. Would
correlate as to whether the patient has evidence of anterior
cruciate ligament avulsion injury. If instability persists, MRI may
be considered for further evaluation, to assess for internal
derangement.

## 2024-05-06 ENCOUNTER — Observation Stay (HOSPITAL_COMMUNITY)
Admission: EM | Admit: 2024-05-06 | Discharge: 2024-05-08 | Disposition: A | Discharge status: Home / Self Care | Attending: Emergency Medicine | Admitting: Emergency Medicine

## 2024-05-06 ENCOUNTER — Encounter (HOSPITAL_COMMUNITY): Payer: Self-pay

## 2024-05-06 ENCOUNTER — Emergency Department (HOSPITAL_COMMUNITY)

## 2024-05-06 DIAGNOSIS — Z6827 Body mass index (BMI) 27.0-27.9, adult: Secondary | ICD-10-CM

## 2024-05-06 DIAGNOSIS — N200 Calculus of kidney: Secondary | ICD-10-CM | POA: Diagnosis not present

## 2024-05-06 DIAGNOSIS — Z87891 Personal history of nicotine dependence: Secondary | ICD-10-CM | POA: Diagnosis not present

## 2024-05-06 DIAGNOSIS — E663 Overweight: Secondary | ICD-10-CM | POA: Diagnosis not present

## 2024-05-06 DIAGNOSIS — N132 Hydronephrosis with renal and ureteral calculous obstruction: Principal | ICD-10-CM | POA: Diagnosis present

## 2024-05-06 DIAGNOSIS — F1090 Alcohol use, unspecified, uncomplicated: Secondary | ICD-10-CM | POA: Diagnosis not present

## 2024-05-06 DIAGNOSIS — Z6841 Body Mass Index (BMI) 40.0 and over, adult: Secondary | ICD-10-CM | POA: Diagnosis not present

## 2024-05-06 DIAGNOSIS — Z87442 Personal history of urinary calculi: Secondary | ICD-10-CM

## 2024-05-06 DIAGNOSIS — N135 Crossing vessel and stricture of ureter without hydronephrosis: Secondary | ICD-10-CM | POA: Diagnosis present

## 2024-05-06 DIAGNOSIS — N130 Hydronephrosis with ureteropelvic junction obstruction: Principal | ICD-10-CM | POA: Insufficient documentation

## 2024-05-06 DIAGNOSIS — F17210 Nicotine dependence, cigarettes, uncomplicated: Secondary | ICD-10-CM | POA: Diagnosis present

## 2024-05-06 DIAGNOSIS — R319 Hematuria, unspecified: Secondary | ICD-10-CM | POA: Diagnosis present

## 2024-05-06 DIAGNOSIS — R109 Unspecified abdominal pain: Principal | ICD-10-CM

## 2024-05-06 DIAGNOSIS — D72829 Elevated white blood cell count, unspecified: Secondary | ICD-10-CM | POA: Diagnosis not present

## 2024-05-06 DIAGNOSIS — B3749 Other urogenital candidiasis: Secondary | ICD-10-CM | POA: Diagnosis present

## 2024-05-06 DIAGNOSIS — N400 Enlarged prostate without lower urinary tract symptoms: Secondary | ICD-10-CM | POA: Diagnosis present

## 2024-05-06 LAB — COMPREHENSIVE METABOLIC PANEL WITH GFR
ALT: 17 U/L (ref 0–44)
AST: 20 U/L (ref 15–41)
Albumin: 4.5 g/dL (ref 3.5–5.0)
Alkaline Phosphatase: 65 U/L (ref 38–126)
Anion gap: 12 (ref 5–15)
BUN: 17 mg/dL (ref 6–20)
CO2: 22 mmol/L (ref 22–32)
Calcium: 9.6 mg/dL (ref 8.9–10.3)
Chloride: 105 mmol/L (ref 98–111)
Creatinine, Ser: 1.21 mg/dL (ref 0.61–1.24)
GFR, Estimated: >60 mL/min (ref >60)
Glucose, Bld: 109 mg/dL — ABNORMAL HIGH (ref 70–99)
Potassium: 4.2 mmol/L (ref 3.5–5.1)
Sodium: 139 mmol/L (ref 135–145)
Total Bilirubin: 0.8 mg/dL (ref 0.0–1.2)
Total Protein: 7.4 g/dL (ref 6.5–8.1)

## 2024-05-06 LAB — CBC
HCT: 47.8 % (ref 39.0–52.0)
Hemoglobin: 15.6 g/dL (ref 13.0–17.0)
MCH: 29.4 pg (ref 26.0–34.0)
MCHC: 32.6 g/dL (ref 30.0–36.0)
MCV: 90 fL (ref 80.0–100.0)
Platelets: 351 K/uL (ref 150–400)
RBC: 5.31 MIL/uL (ref 4.22–5.81)
RDW: 13.6 % (ref 11.5–15.5)
WBC: 12.6 K/uL — ABNORMAL HIGH (ref 4.0–10.5)
nRBC: 0 % (ref 0.0–0.2)

## 2024-05-06 LAB — LIPASE, BLOOD: Lipase: 22 U/L (ref 11–51)

## 2024-05-06 MED ORDER — ONDANSETRON HCL 4 MG/2ML IJ SOLN
4.0000 mg | Freq: Once | INTRAMUSCULAR | Status: AC
  Administered 2024-05-06: 4 mg via INTRAVENOUS
  Filled 2024-05-06: qty 2

## 2024-05-06 MED ORDER — SODIUM CHLORIDE 0.9 % IV BOLUS
1000.0000 mL | Freq: Once | INTRAVENOUS | Status: AC
  Administered 2024-05-06: 1000 mL via INTRAVENOUS

## 2024-05-06 MED ORDER — KETOROLAC TROMETHAMINE 15 MG/ML IJ SOLN
15.0000 mg | Freq: Once | INTRAMUSCULAR | Status: AC
  Administered 2024-05-06: 15 mg via INTRAVENOUS
  Filled 2024-05-06: qty 1

## 2024-05-06 MED ORDER — HYDROMORPHONE HCL 1 MG/ML IJ SOLN
1.0000 mg | Freq: Once | INTRAMUSCULAR | Status: AC
  Administered 2024-05-06: 1 mg via INTRAVENOUS
  Filled 2024-05-06: qty 1

## 2024-05-06 NOTE — ED Triage Notes (Signed)
 Pt states that he has been having L sided flank pain since tonight, started to have hematuria on Tuesday and now is unable to void, nausea started a few hours ago

## 2024-05-06 NOTE — ED Provider Notes (Signed)
 [place] EMERGENCY DEPARTMENT AT [facility] Provider Note   CSN: [id number] Arrival date & time: 05/06/24  2237     Patient presents with: Flank Pain   Adam Norris is a 48 y.o. male with medical history of renal disorder.  Patient presents to ED for evaluation of left-sided flank pain.  States that on Tuesday he began having left-sided flank pain and pressure along with hematuria.  States this resolved however this evening his left-sided flank pain returned.  He reports 10 out of 10 pain.  States that he had 1 episode of nausea and vomiting.  Denies any diarrhea, fevers.  Reports that he was able to trickle some urine out today, reports that he feels he cannot pee.  Denies fevers at home.  Reports history of kidney stones treated with ureteroscopy.    Flank Pain       Prior to Admission medications   Medication Sig Start Date End Date Taking? Authorizing Provider  dexamethasone  (DECADRON ) 4 MG tablet Take 1 tablet (4 mg total) by mouth 2 (two) times daily with a meal. 11/03/15   Armida Culver, PA-C  diclofenac  (VOLTAREN ) 75 MG EC tablet Take 1 tablet (75 mg total) by mouth 2 (two) times daily. 11/03/15   Armida Culver, PA-C  HYDROcodone -acetaminophen  (NORCO/VICODIN) 5-325 MG tablet Take 1 tablet by mouth every 4 (four) hours as needed. 11/03/15   Armida Culver, PA-C  ibuprofen  (ADVIL ,MOTRIN ) 600 MG tablet Take 1 tablet (600 mg total) by mouth every 6 (six) hours as needed. 12/31/14   Idol, Julie, PA-C  L-LYSINE PO Take 500 mg by mouth 2 (two) times daily.    [provider]  nabumetone  (RELAFEN ) 500 MG tablet Take 1 tablet (500 mg total) by mouth 2 (two) times daily. Patient not taking: Reported on 03/02/2015 01/03/15   Margrette Taft BRAVO, MD    Allergies: Patient has no known allergies.    Review of Systems  Gastrointestinal:  Positive for nausea and vomiting.  Genitourinary:  Positive for flank pain.  All other systems reviewed and are  negative.   Updated Vital Signs BP (!) 156/85 (BP Location: Right Arm)   Pulse 76   Temp 98 F (36.7 C) (Oral)   Resp 18   SpO2 100%   Physical Exam Vitals and nursing note reviewed.  Constitutional:      General: He is not in acute distress.    Appearance: He is well-developed.  HENT:     Head: Normocephalic and atraumatic.  Eyes:     Conjunctiva/sclera: Conjunctivae normal.  Cardiovascular:     Rate and Rhythm: Normal rate and regular rhythm.     Heart sounds: No murmur heard. Pulmonary:     Effort: Pulmonary effort is normal. No respiratory distress.     Breath sounds: Normal breath sounds.  Abdominal:     Palpations: Abdomen is soft.     Tenderness: There is no abdominal tenderness. There is left CVA tenderness.  Musculoskeletal:        General: No swelling.     Cervical back: Neck supple.  Skin:    General: Skin is warm and dry.     Capillary Refill: Capillary refill takes less than 2 seconds.  Neurological:     Mental Status: He is alert.  Psychiatric:        Mood and Affect: Mood normal.     (all labs ordered are listed, but only abnormal results are displayed) Labs Reviewed  URINALYSIS, ROUTINE W REFLEX MICROSCOPIC -  Abnormal; Notable for the following components:      Result Value   APPearance CLOUDY (*)    Hgb urine dipstick LARGE (*)    Protein, ur 100 (*)    Bacteria, UA MANY (*)    All other components within normal limits  CBC - Abnormal; Notable for the following components:   WBC 12.6 (*)    All other components within normal limits  COMPREHENSIVE METABOLIC PANEL WITH GFR - Abnormal; Notable for the following components:   Glucose, Bld 109 (*)    All other components within normal limits  URINE CULTURE  LIPASE, BLOOD    EKG: None  Radiology: CT Renal Stone Study Result Date: 05/07/2024 CLINICAL DATA:  Abdominal, flank pain. Left flank pain, nephrolithiasis EXAM: CT ABDOMEN AND PELVIS WITHOUT CONTRAST TECHNIQUE: Multidetector CT imaging  of the abdomen and pelvis was performed following the standard protocol without IV contrast. RADIATION DOSE REDUCTION: This exam was performed according to the departmental dose-optimization program which includes automated exposure control, adjustment of the mA and/or kV according to patient size and/or use of iterative reconstruction technique. COMPARISON:  None Available. FINDINGS: Lower chest: No acute abnormality. Hepatobiliary: No focal liver abnormality is seen. No gallstones, gallbladder wall thickening, or biliary dilatation. Pancreas: Unremarkable Spleen: Unremarkable Adrenals/Urinary Tract: The adrenal glands are unremarkable. The kidneys are normal in size and position. Mild left hydronephrosis and perinephric stranding secondary to an obstructing 3 x 5 mm calculus with left ureteropelvic junction. 2 mm nonobstructing calculus within the pole the right kidney. No hydronephrosis on the right. No additional renal or ureteral calculi are identified. No perinephric fluid collections. Bladder unremarkable. Stomach/Bowel: Stomach is within normal limits. Appendix appears normal. No evidence of bowel wall thickening, distention, or inflammatory changes. Vascular/Lymphatic: Aortic atherosclerosis. No enlarged abdominal or pelvic lymph nodes. Reproductive: Prostate is unremarkable. Other: No abdominal wall hernia or abnormality. No abdominopelvic ascites. 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 No acute bone abnormality. No lytic or blastic bone lesion. IMPRESSION: 1. Obstructing 3 x 5 mm calculus at the left ureteropelvic junction resulting in mild left hydronephrosis and perinephric stranding. 2. Minimal right nonobstructing nephrolithiasis. 3. 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 4. Aortic atherosclerosis. Aortic Atherosclerosis (ICD10-I70.0). Electronically Signed   By: Dorethia Molt M.D.   On: 05/07/2024 00:01    Procedures    Medications Ordered in the ED  magnesium  sulfate IVPB 2 g 50 mL (has no administration in time range)  cefTRIAXone  (ROCEPHIN ) 1 g in sodium chloride  0.9 % 100 mL IVPB (has no administration in time range)  ondansetron  (ZOFRAN ) injection 4 mg (4 mg Intravenous Given 05/06/24 2302)  sodium chloride  0.9 % bolus 1,000 mL (0 mLs Intravenous Stopped 05/07/24 0131)  ketorolac  (TORADOL ) 15 MG/ML injection 15 mg (15 mg Intravenous Given 05/06/24 2318)  HYDROmorphone  (DILAUDID ) injection 1 mg (1 mg Intravenous Given 05/06/24 2318)  HYDROmorphone  (DILAUDID ) injection 1 mg (1 mg Intravenous Given 05/07/24 0252)    Medical Decision Making Amount and/or Complexity of Data Reviewed Labs: ordered. Radiology: ordered.  Risk Prescription drug management.   This is a 48 year old male presenting to the ED out of concern of left flank pain.  On exam, he is hemodynamically stable.  He is afebrile and nontachycardic.  Lung sounds are clear bilaterally, no hypoxia.  Abdomen soft and compressible.  Left-sided CVA tenderness present.  Neurological examination at baseline.   Initially treated with 1 L fluid, 1 mg Dilaudid , 15 mg Toradol , 4 milligram Zofran .  Workup  initiated.  Patient CBC here with leukocytosis 12.6, no anemia.  Metabolic panel grossly unremarkable.  Urinalysis shows large hemoglobin, protein, many bacteria, 0 squamous epithelial cells, mucus and budding yeast.  Urine cultured.  Lipase 22.  CT renal stone study shows 3 x 5 mm obstructing calculus at the left uteropelvic junction resulting in mild left hydronephrosis and perinephric stranding.  Discussed with on-call urologist Dr. Selma who recommends admitting patient to medicine, stenting in the morning.  Patient given 1 g Rocephin  at this time.  Given magnesium .  Have called for admission at this time.   Discussed with Dr. Charlton of [facility] hospitalist who has agreed to admit patient.  Patient amenable to plan.  Stable on admission.   Final diagnoses:   Left flank pain  Nephrolithiasis    ED Discharge Orders     None          Ruthell Lonni JULIANNA DEVONNA 05/07/24 0429    Carita Senior, MD 05/07/24 [phone]

## 2024-05-06 NOTE — ED Provider Notes (Incomplete)
 [place] EMERGENCY DEPARTMENT AT [facility] Provider Note   CSN: [id number] Arrival date & time: 05/06/24  2237     Patient presents with: Flank Pain   Adam Norris is a 48 y.o. male with medical history of renal disorder.  Patient presents to ED for evaluation of left-sided flank pain.  States that on Tuesday he began having left-sided flank pain and pressure along with hematuria.  States this resolved however this evening his left-sided flank pain returned.  He reports 10 out of 10 pain.  States that he had 1 episode of nausea and vomiting.  Denies any diarrhea, fevers.  Reports that he was able to trickle some urine out today, reports that he feels he cannot pee.  Denies fevers at home.  Reports history of kidney stones treated with ureteroscopy.    Flank Pain       Prior to Admission medications   Medication Sig Start Date End Date Taking? Authorizing Provider  dexamethasone  (DECADRON ) 4 MG tablet Take 1 tablet (4 mg total) by mouth 2 (two) times daily with a meal. 11/03/15   Armida Culver, PA-C  diclofenac  (VOLTAREN ) 75 MG EC tablet Take 1 tablet (75 mg total) by mouth 2 (two) times daily. 11/03/15   Armida Culver, PA-C  HYDROcodone -acetaminophen  (NORCO/VICODIN) 5-325 MG tablet Take 1 tablet by mouth every 4 (four) hours as needed. 11/03/15   Armida Culver, PA-C  ibuprofen  (ADVIL ,MOTRIN ) 600 MG tablet Take 1 tablet (600 mg total) by mouth every 6 (six) hours as needed. 12/31/14   Idol, Julie, PA-C  L-LYSINE PO Take 500 mg by mouth 2 (two) times daily.    [provider]  nabumetone  (RELAFEN ) 500 MG tablet Take 1 tablet (500 mg total) by mouth 2 (two) times daily. Patient not taking: Reported on 03/02/2015 01/03/15   Margrette Taft BRAVO, MD    Allergies: Patient has no known allergies.    Review of Systems  Genitourinary:  Positive for flank pain.    Updated Vital Signs BP (!) 147/107   Pulse 71   Temp 98.1 F (36.7 C)   Resp 20   SpO2 96%    Physical Exam  (all labs ordered are listed, but only abnormal results are displayed) Labs Reviewed  CBC - Abnormal; Notable for the following components:      Result Value   WBC 12.6 (*)    All other components within normal limits  URINALYSIS, ROUTINE W REFLEX MICROSCOPIC  COMPREHENSIVE METABOLIC PANEL WITH GFR  LIPASE, BLOOD    EKG: None  Radiology: No results found.  {Document cardiac monitor, telemetry assessment procedure when appropriate:32947} Procedures   Medications Ordered in the ED  ketorolac  (TORADOL ) 15 MG/ML injection 15 mg (has no administration in time range)  HYDROmorphone  (DILAUDID ) injection 1 mg (has no administration in time range)  ondansetron  (ZOFRAN ) injection 4 mg (4 mg Intravenous Given 05/06/24 2302)  sodium chloride  0.9 % bolus 1,000 mL (1,000 mLs Intravenous New Bag/Given 05/06/24 2302)      {Click here for ABCD2, HEART and other calculators REFRESH Note before signing:1}                              Medical Decision Making Amount and/or Complexity of Data Reviewed Labs: ordered. Radiology: ordered.  Risk Prescription drug management.   ***  {Document critical care time when appropriate  Document review of labs and clinical decision tools ie CHADS2VASC2, etc  Document  your independent review of radiology images and any outside records  Document your discussion with family members, caretakers and with consultants  Document social determinants of health affecting pt's care  Document your decision making why or why not admission, treatments were needed:32947:::1}   Final diagnoses:  None    ED Discharge Orders     None

## 2024-05-07 ENCOUNTER — Encounter (HOSPITAL_COMMUNITY): Payer: Self-pay | Admitting: Anesthesiology

## 2024-05-07 ENCOUNTER — Encounter (HOSPITAL_COMMUNITY)
Admission: EM | Disposition: A | Discharge status: Home / Self Care | Payer: Self-pay | Source: Home / Self Care | Attending: Emergency Medicine

## 2024-05-07 ENCOUNTER — Observation Stay (HOSPITAL_COMMUNITY): Admitting: Anesthesiology

## 2024-05-07 ENCOUNTER — Encounter (HOSPITAL_COMMUNITY): Payer: Self-pay | Admitting: Family Medicine

## 2024-05-07 ENCOUNTER — Observation Stay (HOSPITAL_COMMUNITY)

## 2024-05-07 DIAGNOSIS — N135 Crossing vessel and stricture of ureter without hydronephrosis: Secondary | ICD-10-CM | POA: Diagnosis not present

## 2024-05-07 DIAGNOSIS — D72829 Elevated white blood cell count, unspecified: Secondary | ICD-10-CM

## 2024-05-07 DIAGNOSIS — E663 Overweight: Secondary | ICD-10-CM

## 2024-05-07 HISTORY — PX: CYSTOSCOPY WITH URETEROSCOPY AND STENT PLACEMENT: SHX6377

## 2024-05-07 LAB — URINALYSIS, ROUTINE W REFLEX MICROSCOPIC
Bilirubin Urine: NEGATIVE
Glucose, UA: NEGATIVE mg/dL
Ketones, ur: NEGATIVE mg/dL
Leukocytes,Ua: NEGATIVE
Nitrite: NEGATIVE
Protein, ur: 100 mg/dL — AB
RBC / HPF: >50 RBC/hpf (ref 0–5)
Specific Gravity, Urine: 1.026 (ref 1.005–1.030)
pH: 5 (ref 5.0–8.0)

## 2024-05-07 LAB — CBC
HCT: 46.2 % (ref 39.0–52.0)
Hemoglobin: 14.9 g/dL (ref 13.0–17.0)
MCH: 29.7 pg (ref 26.0–34.0)
MCHC: 32.3 g/dL (ref 30.0–36.0)
MCV: 92.2 fL (ref 80.0–100.0)
Platelets: 317 K/uL (ref 150–400)
RBC: 5.01 MIL/uL (ref 4.22–5.81)
RDW: 14 % (ref 11.5–15.5)
WBC: 10.7 K/uL — ABNORMAL HIGH (ref 4.0–10.5)
nRBC: 0 % (ref 0.0–0.2)

## 2024-05-07 LAB — BASIC METABOLIC PANEL WITH GFR
Anion gap: 9 (ref 5–15)
BUN: 16 mg/dL (ref 6–20)
CO2: 25 mmol/L (ref 22–32)
Calcium: 9.1 mg/dL (ref 8.9–10.3)
Chloride: 105 mmol/L (ref 98–111)
Creatinine, Ser: 1.14 mg/dL (ref 0.61–1.24)
GFR, Estimated: >60 mL/min (ref >60)
Glucose, Bld: 99 mg/dL (ref 70–99)
Potassium: 4.5 mmol/L (ref 3.5–5.1)
Sodium: 138 mmol/L (ref 135–145)

## 2024-05-07 LAB — HIV ANTIBODY (ROUTINE TESTING W REFLEX): HIV Screen 4th Generation wRfx: NONREACTIVE

## 2024-05-07 SURGERY — CYSTOURETEROSCOPY, WITH STENT INSERTION
Anesthesia: General | Laterality: Left

## 2024-05-07 SURGERY — CYSTOURETEROSCOPY, WITH STENT INSERTION
Anesthesia: General | Site: Pelvis | Laterality: Left

## 2024-05-07 MED ORDER — HYDROMORPHONE HCL 1 MG/ML IJ SOLN
0.5000 mg | INTRAMUSCULAR | Status: DC | PRN
  Administered 2024-05-07 – 2024-05-08 (×2): 1 mg via INTRAVENOUS
  Filled 2024-05-07 (×2): qty 1

## 2024-05-07 MED ORDER — ACETAMINOPHEN 325 MG PO TABS: 650.0000 mg | ORAL_TABLET | Freq: Four times a day (QID) | ORAL | Status: DC | PRN

## 2024-05-07 MED ORDER — SODIUM CHLORIDE 0.9 % IR SOLN
Status: DC | PRN
  Administered 2024-05-07: 3000 mL via INTRAVESICAL

## 2024-05-07 MED ORDER — MIDAZOLAM HCL 2 MG/2ML IJ SOLN
INTRAMUSCULAR | Status: AC
  Filled 2024-05-07: qty 2

## 2024-05-07 MED ORDER — LACTATED RINGERS IV SOLN: INTRAVENOUS | Status: DC

## 2024-05-07 MED ORDER — CHLORHEXIDINE GLUCONATE 0.12 % MT SOLN
15.0000 mL | Freq: Once | OROMUCOSAL | Status: AC
  Administered 2024-05-07: 15 mL via OROMUCOSAL

## 2024-05-07 MED ORDER — MAGNESIUM SULFATE 2 GM/50ML IV SOLN
2.0000 g | Freq: Once | INTRAVENOUS | Status: AC
  Administered 2024-05-07: 2 g via INTRAVENOUS
  Filled 2024-05-07: qty 50

## 2024-05-07 MED ORDER — PROCHLORPERAZINE EDISYLATE 10 MG/2ML IJ SOLN: 5.0000 mg | INTRAMUSCULAR | Status: DC | PRN

## 2024-05-07 MED ORDER — PHENYLEPHRINE HCL-NACL 20-0.9 MG/250ML-% IV SOLN
INTRAVENOUS | Status: AC
  Filled 2024-05-07: qty 250

## 2024-05-07 MED ORDER — PHENYLEPHRINE 80 MCG/ML (10ML) SYRINGE FOR IV PUSH (FOR BLOOD PRESSURE SUPPORT)
PREFILLED_SYRINGE | INTRAVENOUS | Status: AC
  Filled 2024-05-07: qty 10

## 2024-05-07 MED ORDER — PROPOFOL 10 MG/ML IV BOLUS
INTRAVENOUS | Status: AC
  Filled 2024-05-07: qty 20

## 2024-05-07 MED ORDER — DEXAMETHASONE SODIUM PHOSPHATE 10 MG/ML IJ SOLN
INTRAMUSCULAR | Status: AC
  Filled 2024-05-07: qty 1

## 2024-05-07 MED ORDER — PROPOFOL 10 MG/ML IV BOLUS
INTRAVENOUS | Status: DC | PRN
  Administered 2024-05-07: 180 mg via INTRAVENOUS

## 2024-05-07 MED ORDER — LIDOCAINE HCL (CARDIAC) PF 100 MG/5ML IV SOSY
PREFILLED_SYRINGE | INTRAVENOUS | Status: DC | PRN
Start: 2024-05-07 — End: 2024-05-07
  Administered 2024-05-07: 100 mg via INTRAVENOUS

## 2024-05-07 MED ORDER — ROCURONIUM BROMIDE 10 MG/ML (PF) SYRINGE
PREFILLED_SYRINGE | INTRAVENOUS | Status: AC
  Filled 2024-05-07: qty 10

## 2024-05-07 MED ORDER — LIDOCAINE 2% (20 MG/ML) 5 ML SYRINGE
INTRAMUSCULAR | Status: DC | PRN
  Administered 2024-05-07: 80 mg via INTRAVENOUS

## 2024-05-07 MED ORDER — DEXAMETHASONE SODIUM PHOSPHATE 10 MG/ML IJ SOLN
INTRAMUSCULAR | Status: DC | PRN
  Administered 2024-05-07: 10 mg via INTRAVENOUS

## 2024-05-07 MED ORDER — SODIUM CHLORIDE 0.9% FLUSH
3.0000 mL | Freq: Two times a day (BID) | INTRAVENOUS | Status: DC
  Administered 2024-05-07 – 2024-05-08 (×3): 3 mL via INTRAVENOUS

## 2024-05-07 MED ORDER — SODIUM CHLORIDE 0.9 % IV SOLN
1.0000 g | Freq: Once | INTRAVENOUS | Status: AC
  Administered 2024-05-07: 1 g via INTRAVENOUS
  Filled 2024-05-07: qty 10

## 2024-05-07 MED ORDER — AMISULPRIDE (ANTIEMETIC) 5 MG/2ML IV SOLN: 10.0000 mg | Freq: Once | INTRAVENOUS | Status: DC | PRN

## 2024-05-07 MED ORDER — CEFAZOLIN SODIUM 1 G IJ SOLR
INTRAMUSCULAR | Status: AC
  Filled 2024-05-07: qty 10

## 2024-05-07 MED ORDER — ORAL CARE MOUTH RINSE: 15.0000 mL | Freq: Once | OROMUCOSAL | Status: AC

## 2024-05-07 MED ORDER — FENTANYL CITRATE PF 50 MCG/ML IJ SOSY: 25.0000 ug | PREFILLED_SYRINGE | INTRAMUSCULAR | Status: DC | PRN

## 2024-05-07 MED ORDER — SODIUM CHLORIDE 0.9 % IV SOLN
1.0000 g | INTRAVENOUS | Status: DC
  Administered 2024-05-08: 1 g via INTRAVENOUS
  Filled 2024-05-07: qty 10

## 2024-05-07 MED ORDER — HYDROMORPHONE HCL 1 MG/ML IJ SOLN
1.0000 mg | Freq: Once | INTRAMUSCULAR | Status: AC
  Administered 2024-05-07: 1 mg via INTRAVENOUS
  Filled 2024-05-07: qty 1

## 2024-05-07 MED ORDER — POLYETHYLENE GLYCOL 3350 17 G PO PACK: 17.0000 g | PACK | Freq: Every day | ORAL | Status: DC | PRN

## 2024-05-07 MED ORDER — SODIUM CHLORIDE 0.9 % IV SOLN: INTRAVENOUS | Status: AC

## 2024-05-07 MED ORDER — LIDOCAINE HCL (PF) 2 % IJ SOLN
INTRAMUSCULAR | Status: AC
  Filled 2024-05-07: qty 5

## 2024-05-07 MED ORDER — FLUCONAZOLE IN SODIUM CHLORIDE 200-0.9 MG/100ML-% IV SOLN
200.0000 mg | INTRAVENOUS | Status: DC
  Administered 2024-05-07 – 2024-05-08 (×2): 200 mg via INTRAVENOUS
  Filled 2024-05-07 (×2): qty 100

## 2024-05-07 MED ORDER — IOHEXOL 300 MG/ML  SOLN
INTRAMUSCULAR | Status: DC | PRN
  Administered 2024-05-07: 3 mL

## 2024-05-07 MED ORDER — ONDANSETRON HCL 4 MG/2ML IJ SOLN
INTRAMUSCULAR | Status: DC | PRN
  Administered 2024-05-07: 4 mg via INTRAVENOUS

## 2024-05-07 MED ORDER — ONDANSETRON HCL 4 MG/2ML IJ SOLN: 4.0000 mg | Freq: Once | INTRAMUSCULAR | Status: DC | PRN

## 2024-05-07 MED ORDER — FENTANYL CITRATE (PF) 100 MCG/2ML IJ SOLN
INTRAMUSCULAR | Status: AC
  Filled 2024-05-07: qty 2

## 2024-05-07 MED ORDER — MIDAZOLAM HCL 5 MG/5ML IJ SOLN
INTRAMUSCULAR | Status: DC | PRN
  Administered 2024-05-07: 2 mg via INTRAVENOUS

## 2024-05-07 MED ORDER — CEFAZOLIN SODIUM-DEXTROSE 2-3 GM-%(50ML) IV SOLR
INTRAVENOUS | Status: DC | PRN
  Administered 2024-05-07: 2 g via INTRAVENOUS

## 2024-05-07 MED ORDER — ONDANSETRON HCL 4 MG/2ML IJ SOLN
INTRAMUSCULAR | Status: AC
  Filled 2024-05-07: qty 2

## 2024-05-07 MED ORDER — FENTANYL CITRATE (PF) 100 MCG/2ML IJ SOLN
INTRAMUSCULAR | Status: DC | PRN
  Administered 2024-05-07: 50 ug via INTRAVENOUS

## 2024-05-07 MED ORDER — ACETAMINOPHEN 650 MG RE SUPP: 650.0000 mg | Freq: Four times a day (QID) | RECTAL | Status: DC | PRN

## 2024-05-07 MED ORDER — EPHEDRINE 5 MG/ML INJ
INTRAVENOUS | Status: AC
  Filled 2024-05-07: qty 5

## 2024-05-07 SURGICAL SUPPLY — 20 items
BAG URO CATCHER STRL LF (MISCELLANEOUS) ×1 IMPLANT
CATH URETL OPEN END 6FR 70 (CATHETERS) IMPLANT
CLOTH BEACON ORANGE TIMEOUT ST (SAFETY) ×1 IMPLANT
EXTRACTOR STONE NITINOL NGAGE (UROLOGICAL SUPPLIES) IMPLANT
GLOVE BIOGEL M 7.0 STRL (GLOVE) ×1 IMPLANT
GOWN STRL REUS W/ TWL XL LVL3 (GOWN DISPOSABLE) ×1 IMPLANT
GUIDEWIRE ANG ZIPWIRE 038X150 (WIRE) IMPLANT
GUIDEWIRE STR DUAL SENSOR (WIRE) ×2 IMPLANT
IV NS 1000ML BAXH (IV SOLUTION) ×1 IMPLANT
KIT TURNOVER KIT A (KITS) ×1 IMPLANT
MANIFOLD NEPTUNE II (INSTRUMENTS) ×1 IMPLANT
PACK CYSTO (CUSTOM PROCEDURE TRAY) ×1 IMPLANT
SHEATH DILATOR SET 8/10 (MISCELLANEOUS) IMPLANT
SHEATH NAVIGATOR HD 11/13X28 (SHEATH) IMPLANT
SHEATH NAVIGATOR HD 11/13X36 (SHEATH) IMPLANT
SHEATH NAVIGATOR HD 12/14X46 (SHEATH) IMPLANT
STENT URET 6FRX26 CONTOUR (STENTS) IMPLANT
TRACTIP FLEXIVA PULS ID 200XHI (Laser) IMPLANT
TUBING CONNECTING 10 (TUBING) ×1 IMPLANT
TUBING UROLOGY SET (TUBING) ×1 IMPLANT

## 2024-05-07 NOTE — Hospital Course (Addendum)
 Adam Norris is a 48 y.o. male with medical history significant for nephrolithiasis who presents with hematuria and left flank pain.  Found to have an obstructing left UPJ stone that was 3 x 5 mm.  Taken for a cystoscopy and left ureteral stent placement and cystoscopy demonstrated no suspicious lesions, masses stones or other pathology.  Currently awaiting cultures and sensitivities to tailor antibiotics.  Urinalysis did show budding yeast so he was initiated on fluconazole  as well.  Culture did not really vomiting and history just to oral antibiotics.  WBC did go up but it is trending back down slowly back down and likely reactive in the setting of his procedure.  Will need to follow-up with PCP and urology in outpatient setting within the next few weeks.  Assessment and Plan:  Hematuria and Left Flank Pain in the setting of Obstructing left UPJ stone: Urology was consulted by ED PA, Urinalysis was done and showed cloudy appearance with brown color urine, large hemoglobin negative ketones, negative leukocytes, many bacteria, WBCs present, greater than 50 RBCs per high-power field and 0-5 WBCs.  Urine was sent for culture and pending.  CT scan was done and demonstrated an obstructing 3 x 5 mm left UPJ stone with mild left hydronephrosis and perinephric inflammatory stranding. Patient was started on IV Ceftriaxone  and will continue. C/w IVF at 100 mL/hr for 10 hours, continue analgesia with acetaminophen  p.o./RC 650 mg p.o. every 6 as needed for mild pain, IV hydromorphone  0.5 to 1 mg every 3 as needed for severe pain.  Continue with antiemetics with prochlorperazine  5 mg IV Q4 For nausea or vomiting.  Given his budding yeast in his urine he was initiated on fluconazole  which will be continued.  Urology took the patient for cystoscopy and left ureteral stent placement.  Subsequently the cystoscopy demonstrated no suspicious lesions, masses, stones or other pathology but the left retrograde pyelogram did  demonstrate some mild left hydronephrosis.  Urology recommends 2 weeks of fluconazole  and 1 week of culture specific antibiotics and arranging outpatient follow-up for treatment of his stone and stent removal.  No longer n.p.o. and diet has been advanced.  Urine remains largely clear and yellow-tinged and voiding trial has been done and Foley catheter removed.  Urology feels that he can be discharged from their perspective however will need to follow-up on his culture and sensitivities to tailor antibiotics for discharge. Initial U/A showed 10,000 CFU of Insiginficant Growth and repeat U/A showed No Growth.  It was discussed with ID who recommends transitioning the patient to oral ciprofloxacin  for total 10 days and will also continue fluconazole  200 mg p.o. daily for 14 days.  Urology feels he can be discharged from their perspective he is medically stable for discharge at this time.  Leukocytosis: From Above. WBC went from 12.6 -> 10.7 -> 20.0 -> 18.1. C/w Treatment as above. CTM for S/Sx of Infection. Repeat CBC in the AM  Overweight: Complicates overall prognosis and care. Estimated body mass index is 27.51 kg/m as calculated from the following:   Height as of this encounter: 5' 11.5 (1.816 m).   Weight as of this encounter: 90.7 kg. Weight Loss and Dietary Counseling given

## 2024-05-07 NOTE — Plan of Care (Signed)

## 2024-05-07 NOTE — Progress Notes (Signed)
 Touch base with nursing.  Urine remained largely clear, yellow-tinged.  Advised to proceed with voiding trial, refilling bladder prior to Foley removal.  Once he voids he is cleared to discharge from urologic perspective.

## 2024-05-07 NOTE — ED Notes (Signed)
Lab called for urine culture ?

## 2024-05-07 NOTE — Consult Note (Signed)
 Urology Consult   Physician requesting consult: Evalene Sprinkles, MD  Reason for consult: Obstructing left ureteral stone with candiduria  History of Present Illness: Adam Norris is a 48 y.o. with history of urolithiasis who underwent ESWL and ureteroscopy in the past who presents today with complaints of left-sided flank pain.  He was found to be afebrile with leukocytosis, urinalysis brown, cloudy, many bacteria and budding yeast present.  CT A/P 05/07/2024 revealed 3 x 5 mm stone in the left ureteropelvic junction with mild left hydronephrosis.  Also present with a 2 mm nonobstructing stone in the right kidney. His pain is currently well-controlled.  He is afebrile and comfortable.  Past Medical History:  Diagnosis Date   Renal disorder    kidney stone    Past Surgical History:  Procedure Laterality Date   COLONOSCOPY     HAND SURGERY     VASECTOMY     WRIST FRACTURE SURGERY      Current Hospital Medications:  Home Meds:  No current facility-administered medications on file prior to encounter.   Current Outpatient Medications on File Prior to Encounter  Medication Sig Dispense Refill   L-LYSINE PO Take 500 mg by mouth 2 (two) times daily.     dexamethasone  (DECADRON ) 4 MG tablet Take 1 tablet (4 mg total) by mouth 2 (two) times daily with a meal. 12 tablet 0   diclofenac  (VOLTAREN ) 75 MG EC tablet Take 1 tablet (75 mg total) by mouth 2 (two) times daily. 12 tablet 0   HYDROcodone -acetaminophen  (NORCO/VICODIN) 5-325 MG tablet Take 1 tablet by mouth every 4 (four) hours as needed. 15 tablet 0   ibuprofen  (ADVIL ,MOTRIN ) 600 MG tablet Take 1 tablet (600 mg total) by mouth every 6 (six) hours as needed. 30 tablet 0   nabumetone  (RELAFEN ) 500 MG tablet Take 1 tablet (500 mg total) by mouth 2 (two) times daily. (Patient not taking: Reported on 03/02/2015) 60 tablet 5     Scheduled Meds:  [MAR Hold] sodium chloride  flush  3 mL Intravenous Q12H   Continuous Infusions:  sodium  chloride     [MAR Hold] cefTRIAXone  (ROCEPHIN )  IV     lactated ringers  10 mL/hr at 05/07/24 0556   PRN Meds:.[MAR Hold] acetaminophen  **OR** [MAR Hold] acetaminophen , [MAR Hold]  HYDROmorphone  (DILAUDID ) injection, [MAR Hold] polyethylene glycol, [MAR Hold] prochlorperazine   Allergies: No Known Allergies  History reviewed. No pertinent family history.  Social History:  reports that he has been smoking cigarettes. He does not have any smokeless tobacco history on file. He reports current alcohol use. He reports current drug use. Drug: Marijuana.  ROS: A complete review of systems was performed.  All systems are negative except for pertinent findings as noted.  Physical Exam:  Vital signs in last 24 hours: Temp:  [98 F (36.7 C)-98.6 F (37 C)] 98.1 F (36.7 C) (09/05 0548) Pulse Rate:  [58-76] 58 (09/05 0548) Resp:  [18-20] 18 (09/05 0548) BP: (137-156)/(77-107) 137/92 (09/05 0548) SpO2:  [96 %-100 %] 98 % (09/05 0548) Weight:  [90.7 kg] 90.7 kg (09/05 0547) Constitutional:  Alert and oriented, No acute distress Cardiovascular: Regular rate and rhythm Respiratory: Normal respiratory effort, Lungs clear bilaterally GI: Abdomen is soft, nontender, nondistended, no abdominal masses GU: No CVA tenderness Neurologic: Grossly intact, no focal deficits Psychiatric: Normal mood and affect  Laboratory Data:  Recent Labs    05/06/24 2252  WBC 12.6*  HGB 15.6  HCT 47.8  PLT 351    Recent Labs  05/06/24 2252  NA 139  K 4.2  CL 105  GLUCOSE 109*  BUN 17  CALCIUM 9.6  CREATININE 1.21     Results for orders placed or performed during the hospital encounter of 05/06/24 (from the past 24 hours)  CBC     Status: Abnormal   Collection Time: 05/06/24 10:52 PM  Result Value Ref Range   WBC 12.6 (H) 4.0 - 10.5 K/uL   RBC 5.31 4.22 - 5.81 MIL/uL   Hemoglobin 15.6 13.0 - 17.0 g/dL   HCT 52.1 60.9 - 47.9 %   MCV 90.0 80.0 - 100.0 fL   MCH 29.4 26.0 - 34.0 pg   MCHC 32.6  30.0 - 36.0 g/dL   RDW 86.3 88.4 - 84.4 %   Platelets 351 150 - 400 K/uL   nRBC 0.0 0.0 - 0.2 %  Comprehensive metabolic panel     Status: Abnormal   Collection Time: 05/06/24 10:52 PM  Result Value Ref Range   Sodium 139 135 - 145 mmol/L   Potassium 4.2 3.5 - 5.1 mmol/L   Chloride 105 98 - 111 mmol/L   CO2 22 22 - 32 mmol/L   Glucose, Bld 109 (H) 70 - 99 mg/dL   BUN 17 6 - 20 mg/dL   Creatinine, Ser 8.78 0.61 - 1.24 mg/dL   Calcium 9.6 8.9 - 89.6 mg/dL   Total Protein 7.4 6.5 - 8.1 g/dL   Albumin 4.5 3.5 - 5.0 g/dL   AST 20 15 - 41 U/L   ALT 17 0 - 44 U/L   Alkaline Phosphatase 65 38 - 126 U/L   Total Bilirubin 0.8 0.0 - 1.2 mg/dL   GFR, Estimated >39 >39 mL/min   Anion gap 12 5 - 15  Lipase, blood     Status: None   Collection Time: 05/06/24 10:52 PM  Result Value Ref Range   Lipase 22 11 - 51 U/L  Urinalysis, Routine w reflex microscopic -Urine, Clean Catch     Status: Abnormal   Collection Time: 05/07/24  1:18 AM  Result Value Ref Range   Color, Urine BROWN (A) YELLOW   APPearance CLOUDY (A) CLEAR   Specific Gravity, Urine 1.026 1.005 - 1.030   pH 5.0 5.0 - 8.0   Glucose, UA NEGATIVE NEGATIVE mg/dL   Hgb urine dipstick LARGE (A) NEGATIVE   Bilirubin Urine NEGATIVE NEGATIVE   Ketones, ur NEGATIVE NEGATIVE mg/dL   Protein, ur 899 (A) NEGATIVE mg/dL   Nitrite NEGATIVE NEGATIVE   Leukocytes,Ua NEGATIVE NEGATIVE   RBC / HPF >50 0 - 5 RBC/hpf   WBC, UA 0-5 0 - 5 WBC/hpf   Bacteria, UA MANY (A) NONE SEEN   Squamous Epithelial / HPF 0-5 0 - 5 /HPF   Mucus PRESENT    Budding Yeast PRESENT    Ca Oxalate Crys, UA PRESENT    No results found for this or any previous visit (from the past 240 hours).  Renal Function: Recent Labs    05/06/24 2252  CREATININE 1.21   Estimated Creatinine Clearance: 80.8 mL/min (by C-G formula based on SCr of 1.21 mg/dL).  Radiologic Imaging: CT Renal Stone Study Result Date: 05/07/2024 CLINICAL DATA:  Abdominal, flank pain. Left flank  pain, nephrolithiasis EXAM: CT ABDOMEN AND PELVIS WITHOUT CONTRAST TECHNIQUE: Multidetector CT imaging of the abdomen and pelvis was performed following the standard protocol without IV contrast. RADIATION DOSE REDUCTION: This exam was performed according to the departmental dose-optimization program which includes automated exposure control, adjustment  of the mA and/or kV according to patient size and/or use of iterative reconstruction technique. COMPARISON:  None Available. FINDINGS: Lower chest: No acute abnormality. Hepatobiliary: No focal liver abnormality is seen. No gallstones, gallbladder wall thickening, or biliary dilatation. Pancreas: Unremarkable Spleen: Unremarkable Adrenals/Urinary Tract: The adrenal glands are unremarkable. The kidneys are normal in size and position. Mild left hydronephrosis and perinephric stranding secondary to an obstructing 3 x 5 mm calculus with left ureteropelvic junction. 2 mm nonobstructing calculus within the pole the right kidney. No hydronephrosis on the right. No additional renal or ureteral calculi are identified. No perinephric fluid collections. Bladder unremarkable. Stomach/Bowel: Stomach is within normal limits. Appendix appears normal. No evidence of bowel wall thickening, distention, or inflammatory changes. Vascular/Lymphatic: Aortic atherosclerosis. No enlarged abdominal or pelvic lymph nodes. Reproductive: Prostate is unremarkable. Other: No abdominal wall hernia or abnormality. No abdominopelvic ascites. 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 No acute bone abnormality. No lytic or blastic bone lesion. IMPRESSION: 1. Obstructing 3 x 5 mm calculus at the left ureteropelvic junction resulting in mild left hydronephrosis and perinephric stranding. 2. Minimal right nonobstructing nephrolithiasis. 3. 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 4. Aortic atherosclerosis. Aortic Atherosclerosis  (ICD10-I70.0). Electronically Signed   By: Dorethia Molt M.D.   On: 05/07/2024 00:01    I independently reviewed the above imaging studies.  Impression/Recommendation Obstructing left ureteral stone with infection with urinalysis indicating many bacteria and budding yeast  - Will take the operating room for cystoscopy, left retrograde pyelogram, left stent placement. - Follow-up urine culture.  Will need 2 weeks of fluconazole  and at least 1 week of culture specific antibiotics. - Will arrange follow-up for definitive treatment of stone --The risks, benefits and alternatives of cystoscopy with left JJ stent placement was discussed with the patient.  Risks include, but are not limited to: bleeding, urinary tract infection, ureteral injury, ureteral stricture disease, chronic pain, urinary symptoms, bladder injury, stent migration, the need for nephrostomy tube placement, MI, CVA, DVT, PE and the inherent risks with general anesthesia.  The patient voices understanding and wishes to proceed.   Matt R. Shahidah Nesbitt MD 05/07/2024, 6:51 AM  [facility]  Pager: [phone]

## 2024-05-07 NOTE — Transfer of Care (Signed)
 Immediate Anesthesia Transfer of Care Note  Patient: Adam Norris  Procedure(s) Performed: ERNESTA, WITH STENT INSERTION, RETROGRADE PYELOGRAM (Left: Pelvis)  Patient Location: PACU  Anesthesia Type:General  Level of Consciousness: awake, alert , oriented, and patient cooperative  Airway & Oxygen Therapy: Patient Spontanous Breathing and Patient connected to face mask oxygen  Post-op Assessment: Report given to RN and Post -op Vital signs reviewed and stable  Post vital signs: Reviewed and stable  Last Vitals:  Vitals Value Taken Time  BP 136/89 05/07/24 07:34  Temp    Pulse 59 05/07/24 07:37  Resp 15 05/07/24 07:37  SpO2 100 % 05/07/24 07:37  Vitals shown include unfiled device data.  Last Pain:  Vitals:   05/07/24 0547  TempSrc:   PainSc: 4       Patients Stated Pain Goal: 3 (05/07/24 0547)  Complications: No notable events documented.

## 2024-05-07 NOTE — Anesthesia Postprocedure Evaluation (Signed)
 Anesthesia Post Note  Patient: Adam Norris  Procedure(s) Performed: ERNESTA, WITH STENT INSERTION, RETROGRADE PYELOGRAM (Left: Pelvis)     Patient location during evaluation: PACU Anesthesia Type: General Level of consciousness: awake and alert Pain management: pain level controlled Vital Signs Assessment: post-procedure vital signs reviewed and stable Respiratory status: spontaneous breathing, nonlabored ventilation and respiratory function stable Cardiovascular status: blood pressure returned to baseline and stable Postop Assessment: no apparent nausea or vomiting Anesthetic complications: no   No notable events documented.  Last Vitals:  Vitals:   05/07/24 0815 05/07/24 0950  BP: (!) 141/94 (!) 144/85  Pulse: 61 (!) 53  Resp: 12 16  Temp:  36.7 C  SpO2: 97% 100%    Last Pain:  Vitals:   05/07/24 0815  TempSrc:   PainSc: 0-No pain                 Garnette FORBES Skillern

## 2024-05-07 NOTE — Anesthesia Preprocedure Evaluation (Addendum)
 Anesthesia Evaluation  Patient identified by MRN, date of birth, ID band Patient awake    Reviewed: Allergy & Precautions, NPO status , Patient's Chart, lab work & pertinent test results  Airway Mallampati: II  TM Distance: >3 FB Neck ROM: Full    Dental  (+) Dental Advisory Given, Chipped, Missing,    Pulmonary Current Smoker and Patient abstained from smoking.   Pulmonary exam normal breath sounds clear to auscultation       Cardiovascular negative cardio ROS Normal cardiovascular exam Rhythm:Regular Rate:Normal     Neuro/Psych negative neurological ROS  negative psych ROS   GI/Hepatic negative GI ROS, Neg liver ROS,,,  Endo/Other  negative endocrine ROS    Renal/GU Renal disease (Left Uretheral Stone infection)     Musculoskeletal negative musculoskeletal ROS (+)    Abdominal   Peds  Hematology negative hematology ROS (+)   Anesthesia Other Findings Day of surgery medications reviewed with the patient.  Reproductive/Obstetrics                              Anesthesia Physical Anesthesia Plan  ASA: 2  Anesthesia Plan: General   Post-op Pain Management: Tylenol  PO (pre-op)* and Toradol  IV (intra-op)*   Induction: Intravenous  PONV Risk Score and Plan: 2 and Midazolam , Dexamethasone  and Ondansetron   Airway Management Planned: LMA  Additional Equipment:   Intra-op Plan:   Post-operative Plan: Extubation in OR  Informed Consent: I have reviewed the patients History and Physical, chart, labs and discussed the procedure including the risks, benefits and alternatives for the proposed anesthesia with the patient or authorized representative who has indicated his/her understanding and acceptance.     Dental advisory given  Plan Discussed with: CRNA  Anesthesia Plan Comments:          Anesthesia Quick Evaluation

## 2024-05-07 NOTE — ED Notes (Signed)
 Patient transported to CT

## 2024-05-07 NOTE — Progress Notes (Signed)
 PROGRESS NOTE    Adam Norris  FMW:[id number] DOB: 1975-11-10 DOA: 05/06/2024 PCP: Freddrick, No   Brief Narrative:  Adam Norris is a 48 y.o. male with medical history significant for nephrolithiasis who presents with hematuria and left flank pain.  Found to have an obstructing left UPJ stone that was 3 x 5 mm.  Taken for a cystoscopy and left ureteral stent placement and cystoscopy demonstrated no suspicious lesions, masses stones or other pathology.  Currently awaiting cultures and sensitivities to tailor antibiotics.  Urinalysis did show budding yeast so he was initiated on fluconazole  as well.  Assessment and Plan:  Hematuria and Left Flank Pain in the setting of Obstructing left UPJ stone: Urology was consulted by ED PA, Urinalysis was done and showed cloudy appearance with brown color urine, large hemoglobin negative ketones, negative leukocytes, many bacteria, WBCs present, greater than 50 RBCs per high-power field and 0-5 WBCs.  Urine was sent for culture and pending.  CT scan was done and demonstrated an obstructing 3 x 5 mm left UPJ stone with mild left hydronephrosis and perinephric inflammatory stranding. Patient was started on IV Ceftriaxone  and will continue. C/w IVF at 100 mL/hr for 10 hours, continue analgesia with acetaminophen  p.o./RC 650 mg p.o. every 6 as needed for mild pain, IV hydromorphone  0.5 to 1 mg every 3 as needed for severe pain.  Continue with antiemetics with prochlorperazine  5 mg IV Q4 For nausea or vomiting.  Given his budding yeast in his urine he was initiated on fluconazole  which will be continued.  Urology took the patient for cystoscopy and left ureteral stent placement.  Subsequently the cystoscopy demonstrated no suspicious lesions, masses, stones or other pathology but the left retrograde pyelogram did demonstrate some mild left hydronephrosis.  Urology recommends 2 weeks of fluconazole  and 1 week of culture specific antibiotics and arranging outpatient follow-up  for treatment of his stone and stent removal.  No longer n.p.o. and diet has been advanced.  Urine remains largely clear and yellow-tinged and voiding trial has been done and Foley catheter removed.  Urology feels that he can be discharged from their perspective however will need to follow-up on his culture and sensitivities to tailor antibiotics for discharge.  Leukocytosis: From Above. Improving as WBC went from 12.6 -> 10.7. C/w Treatment as above. CTM for S/Sx of Infection. Repeat CBC in the AM  Overweight: Complicates overall prognosis and care. Estimated body mass index is 27.51 kg/m as calculated from the following:   Height as of this encounter: 5' 11.5 (1.816 m).   Weight as of this encounter: 90.7 kg. Weight Loss and Dietary Counseling given   DVT prophylaxis: SCDs Start: 05/07/24 0451    Code Status: Full Code Family Communication: No family currently at bedside  Disposition Plan:  Level of care: Telemetry Status is: Inpatient Remains inpatient appropriate because: Needs urine culture sensitivities result prior to tailor antibiotics to discharge   Consultants:  Urology  Procedure(s) by Dr. Selma:  1.  Cystoscopy 2.  Left retrograde pyelogram with interpretation 3.  Left ureteral stent placement 4. Fluoroscopy <1 hour with intraoperative interpretation  Antimicrobials:  Anti-infectives (From admission, onward)    Start     Dose/Rate Route Frequency Ordered Stop   05/08/24 0415  cefTRIAXone  (ROCEPHIN ) 1 g in sodium chloride  0.9 % 100 mL IVPB        1 g 200 mL/hr over 30 Minutes Intravenous Every 24 hours 05/07/24 0452     05/07/24 0730  cefTRIAXone  (ROCEPHIN )  1 g in sodium chloride  0.9 % 100 mL IVPB        1 g 200 mL/hr over 30 Minutes Intravenous  Once 05/07/24 0725 05/07/24 0853   05/07/24 0700  fluconazole  (DIFLUCAN ) IVPB 200 mg        200 mg 100 mL/hr over 60 Minutes Intravenous Every 24 hours 05/07/24 0655     05/07/24 0415  cefTRIAXone  (ROCEPHIN ) 1 g in sodium  chloride 0.9 % 100 mL IVPB        1 g 200 mL/hr over 30 Minutes Intravenous  Once 05/07/24 0410 05/07/24 0824       Subjective: Seen and examined at bedside in had come back from cystoscopy complaining about some pressure in his abdomen.  No nausea or vomiting.  Denied any lightheadedness or dizziness.  No other concerns or complaints at this time.  Objective: Vitals:   05/07/24 0800 05/07/24 0815 05/07/24 0950 05/07/24 1335  BP: (!) 150/80 (!) 141/94 (!) 144/85 128/83  Pulse: (!) 53 61 (!) 53 (!) 59  Resp: 12 12 16 18   Temp:   98 F (36.7 C) 97.7 F (36.5 C)  TempSrc:      SpO2: 99% 97% 100% 99%  Weight:      Height:        Intake/Output Summary (Last 24 hours) at 05/07/2024 1458 Last data filed at 05/07/2024 0815 Gross per 24 hour  Intake 1668 ml  Output 50 ml  Net 1618 ml   Filed Weights   05/07/24 0547  Weight: 90.7 kg   Examination: Physical Exam:  Constitutional: WN/WD overweight Caucasian male in no acute distress Respiratory: Diminished to auscultation bilaterally, no wheezing, rales, rhonchi or crackles. Normal respiratory effort and patient is not tachypenic. No accessory muscle use.  Unlabored breathing Cardiovascular: RRR, no murmurs / rubs / gallops. S1 and S2 auscultated. No extremity edema.  Abdomen: Soft, a little tender to palpate, distended secondary to body habitus. Bowel sounds positive.  GU: Deferred. Musculoskeletal: No clubbing / cyanosis of digits/nails. No joint deformity upper and lower extremities.  Skin: No rashes, lesions, ulcers on limited skin evaluation. No induration; Warm and dry.  Neurologic: CN 2-12 grossly intact with no focal deficits. Romberg sign and cerebellar reflexes not assessed.  Psychiatric: Normal judgment and insight. Alert and oriented x 3. Normal mood and appropriate affect.   Data Reviewed: I have personally reviewed following labs and imaging studies  CBC: Recent Labs  Lab 05/06/24 2252 05/07/24 0854  WBC 12.6*  10.7*  HGB 15.6 14.9  HCT 47.8 46.2  MCV 90.0 92.2  PLT 351 317   Basic Metabolic Panel: Recent Labs  Lab 05/06/24 2252 05/07/24 0854  NA 139 138  K 4.2 4.5  CL 105 105  CO2 22 25  GLUCOSE 109* 99  BUN 17 16  CREATININE 1.21 1.14  CALCIUM 9.6 9.1   GFR: Estimated Creatinine Clearance: 85.7 mL/min (by C-G formula based on SCr of 1.14 mg/dL). Liver Function Tests: Recent Labs  Lab 05/06/24 2252  AST 20  ALT 17  ALKPHOS 65  BILITOT 0.8  PROT 7.4  ALBUMIN 4.5   Recent Labs  Lab 05/06/24 2252  LIPASE 22   No results for input(s): AMMONIA in the last 168 hours. Coagulation Profile: No results for input(s): INR, PROTIME in the last 168 hours. Cardiac Enzymes: No results for input(s): CKTOTAL, CKMB, CKMBINDEX, TROPONINI in the last 168 hours. BNP (last 3 results) No results for input(s): PROBNP in the last 8760 hours.  HbA1C: No results for input(s): HGBA1C in the last 72 hours. CBG: No results for input(s): GLUCAP in the last 168 hours. Lipid Profile: No results for input(s): CHOL, HDL, LDLCALC, TRIG, CHOLHDL, LDLDIRECT in the last 72 hours. Thyroid Function Tests: No results for input(s): TSH, T4TOTAL, FREET4, T3FREE, THYROIDAB in the last 72 hours. Anemia Panel: No results for input(s): VITAMINB12, FOLATE, FERRITIN, TIBC, IRON, RETICCTPCT in the last 72 hours. Sepsis Labs: No results for input(s): PROCALCITON, LATICACIDVEN in the last 168 hours.  No results found for this or any previous visit (from the past 240 hours).   Radiology Studies: DG C-Arm 1-60 Min-No Report Result Date: 05/07/2024 Fluoroscopy was utilized by the requesting physician.  No radiographic interpretation.   CT Renal Stone Study Result Date: 05/07/2024 CLINICAL DATA:  Abdominal, flank pain. Left flank pain, nephrolithiasis EXAM: CT ABDOMEN AND PELVIS WITHOUT CONTRAST TECHNIQUE: Multidetector CT imaging of the abdomen and pelvis was  performed following the standard protocol without IV contrast. RADIATION DOSE REDUCTION: This exam was performed according to the departmental dose-optimization program which includes automated exposure control, adjustment of the mA and/or kV according to patient size and/or use of iterative reconstruction technique. COMPARISON:  None Available. FINDINGS: Lower chest: No acute abnormality. Hepatobiliary: No focal liver abnormality is seen. No gallstones, gallbladder wall thickening, or biliary dilatation. Pancreas: Unremarkable Spleen: Unremarkable Adrenals/Urinary Tract: The adrenal glands are unremarkable. The kidneys are normal in size and position. Mild left hydronephrosis and perinephric stranding secondary to an obstructing 3 x 5 mm calculus with left ureteropelvic junction. 2 mm nonobstructing calculus within the pole the right kidney. No hydronephrosis on the right. No additional renal or ureteral calculi are identified. No perinephric fluid collections. Bladder unremarkable. Stomach/Bowel: Stomach is within normal limits. Appendix appears normal. No evidence of bowel wall thickening, distention, or inflammatory changes. Vascular/Lymphatic: Aortic atherosclerosis. No enlarged abdominal or pelvic lymph nodes. Reproductive: Prostate is unremarkable. Other: No abdominal wall hernia or abnormality. No abdominopelvic ascites. 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 No acute bone abnormality. No lytic or blastic bone lesion. IMPRESSION: 1. Obstructing 3 x 5 mm calculus at the left ureteropelvic junction resulting in mild left hydronephrosis and perinephric stranding. 2. Minimal right nonobstructing nephrolithiasis. 3. 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 4. Aortic atherosclerosis. Aortic Atherosclerosis (ICD10-I70.0). Electronically Signed   By: Dorethia Molt M.D.   On: 05/07/2024 00:01   Scheduled Meds:  phenylephrine        sodium chloride   flush  3 mL Intravenous Q12H   Continuous Infusions:  sodium chloride      [START ON 05/08/2024] cefTRIAXone  (ROCEPHIN )  IV     fluconazole  (DIFLUCAN ) IV      LOS: 0 days   Alejandro Marker, DO [facility] Available via Epic secure chat 7am-7pm After these hours, please refer to coverage provider listed on [email or web address] 05/07/2024, 2:58 PM

## 2024-05-07 NOTE — Op Note (Signed)
 Operative Note  Preoperative diagnosis:  1.  Left ureteral stone with infection  Postoperative diagnosis: 1.  Same  Procedure(s): 1.  Cystoscopy 2.  Left retrograde pyelogram with interpretation 3.  Left ureteral stent placement 4. Fluoroscopy <1 hour with intraoperative interpretation  Surgeon: Donnice Siad, MD  Assistants:  None  Anesthesia:  General  Complications:  None  EBL: Minimal  Specimens: 1. Left renal pelvis urine culture  Drains/Catheters: 1.  Left 6Fr x 26cm ureteral stent  Intraoperative findings:   Cystoscopy demonstrated no suspicious lesions, masses, stones or other pathology. Left retrograde pyelogram demonstrated mild left hydronephrosis. Successful left ureteral stent placement with curl in the renal pelvis and bladder respectively.  Indication:  Adam Norris is a 48 y.o. male with a 5 mm left UPJ stone with infection with urinalysis indicating bacteria and budding yeast.  After reviewing the management options for treatment, he elected to proceed with the above surgical procedure(s). We have discussed the potential benefits and risks of the procedure, side effects of the proposed treatment, the likelihood of the patient achieving the goals of the procedure, and any potential problems that might occur during the procedure or recuperation. Informed consent has been obtained.  Description of procedure: The patient was taken to the operating room and general anesthesia was induced.  The patient was placed in the dorsal lithotomy position, prepped and draped in the usual sterile fashion, and preoperative antibiotics were administered. A preoperative time-out was performed.   Cystourethroscopy was performed.  The patient's urethra was examined and was normal. There was some bilobar prostatic hypertrophy. The bladder was then systematically examined in its entirety. There was no evidence for any bladder tumors, stones, or other mucosal pathology.     Attention then turned to the left ureteral orifice. A 0.038 zip wire was passed through the left orifice and over the wire a 5 Fr open ended catheter was inserted and passed up to the level of the renal pelvis. There was a hydronephrotic drip. Aspirate was obtained and sent off as left renal pelvis urine for culture. Omnipaque  contrast was injected through the ureteral catheter and a retrograde pyelogram was performed with findings as dictated above. The wire was then replaced and the open ended catheter was removed.   A 6Fr x 26cm ureteral stent was advance over the wire. The stent was positioned appropriately under fluoroscopic and cystoscopic guidance.  The wire was then removed with an adequate stent curl noted in the renal pelvis as well as in the bladder.  The bladder was then emptied and the procedure ended.  The patient appeared to tolerate the procedure well and without complications.  The patient was able to be awakened and transferred to the recovery unit in satisfactory condition.   Plan: Admit to medicine.  Follow-up urine culture.  Will need at least 2 weeks of fluconazole  and 1 week of culture specific antibiotics.  Will arrange follow-up for outpatient treatment of stent.  Matt R. Patriciaann Rabanal MD [facility]  Pager: [phone]

## 2024-05-07 NOTE — H&P (Signed)
 History and Physical    Adam Norris FMW:[id number] DOB: [birth year] DOA: 05/06/2024  PCP: Pcp, No   Patient coming from: Home   Chief Complaint: hematuria, flank pain   HPI: Adam Norris is a 48 y.o. male with medical history significant for nephrolithiasis who presents with hematuria and left flank pain.  Patient noted that his urine was cloudy and dark red on 05/04/2024.  Urine seemed to clear the following day and he continued to go about his usual activities until he developed left flank pain yesterday evening.  He also noted his urine to be dark red and cloudy again.  He describes his pain as severe, radiating from the left flank around to his groin, constant, and without any alleviating or exacerbating factors identified.  He denies fevers or chills, denies dysuria, but has had some difficulty voiding since yesterday evening.  ED Course: Upon arrival to the ED, patient is found to be afebrile and saturating well on room air with normal HR and stable BP.  Labs are most notable for normal creatinine and WBC 12,600.  Urine notable for bacteriuria, hematuria, and proteinuria.  CT demonstrates obstructing 3 x 5 mm left UPJ stone with mild left hydronephrosis and perinephric inflammatory stranding.  Urology (Dr. Selma) was consulted by the ED PA, urine was sent for culture, and the patient was treated with 1 L NS, IV Rocephin , Toradol , Dilaudid  x 2, and Zofran .  Review of Systems:  All other systems reviewed and apart from HPI, are negative.  Past Medical History:  Diagnosis Date   Renal disorder    kidney stone    Past Surgical History:  Procedure Laterality Date   COLONOSCOPY     HAND SURGERY     VASECTOMY     WRIST FRACTURE SURGERY      Social History:   reports that he has quit smoking. He does not have any smokeless tobacco history on file. He reports current alcohol use. He reports that he does not use drugs.  No Known Allergies  History reviewed. No pertinent family  history.   Prior to Admission medications   Medication Sig Start Date End Date Taking? Authorizing Provider  dexamethasone  (DECADRON ) 4 MG tablet Take 1 tablet (4 mg total) by mouth 2 (two) times daily with a meal. 11/03/15   Armida Culver, PA-C  diclofenac  (VOLTAREN ) 75 MG EC tablet Take 1 tablet (75 mg total) by mouth 2 (two) times daily. 11/03/15   Armida Culver, PA-C  HYDROcodone -acetaminophen  (NORCO/VICODIN) 5-325 MG tablet Take 1 tablet by mouth every 4 (four) hours as needed. 11/03/15   Armida Culver, PA-C  ibuprofen  (ADVIL ,MOTRIN ) 600 MG tablet Take 1 tablet (600 mg total) by mouth every 6 (six) hours as needed. 12/31/14   Idol, Julie, PA-C  L-LYSINE PO Take 500 mg by mouth 2 (two) times daily.    [provider]  nabumetone  (RELAFEN ) 500 MG tablet Take 1 tablet (500 mg total) by mouth 2 (two) times daily. Patient not taking: Reported on 03/02/2015 01/03/15   Margrette Taft BRAVO, MD    Physical Exam: Vitals:   05/06/24 2243 05/07/24 0114 05/07/24 0400  BP: (!) 147/107 (!) 156/85 138/77  Pulse: 71 76 72  Resp: 20 18   Temp: 98.1 F (36.7 C) 98 F (36.7 C)   TempSrc:  Oral   SpO2: 96% 100% 96%    Constitutional: NAD, calm  Eyes: PERTLA, lids and conjunctivae normal ENMT: Mucous membranes are moist. Posterior pharynx clear of any exudate  or lesions.   Neck: supple, no masses  Respiratory: no wheezing, no crackles. No accessory muscle use.  Cardiovascular: S1 & S2 heard, regular rate and rhythm. No extremity edema.   Abdomen: No tenderness, soft. Bowel sounds active.  Musculoskeletal: no clubbing / cyanosis. No joint deformity upper and lower extremities.   Skin: no significant rashes, lesions, ulcers. Warm, dry, well-perfused. Neurologic: CN 2-12 grossly intact. Moving all extremities. Alert and oriented.  Psychiatric: Pleasant. Cooperative.    Labs and Imaging on Admission: I have personally reviewed following labs and imaging studies  CBC: Recent Labs  Lab  05/06/24 2252  WBC 12.6*  HGB 15.6  HCT 47.8  MCV 90.0  PLT 351   Basic Metabolic Panel: Recent Labs  Lab 05/06/24 2252  NA 139  K 4.2  CL 105  CO2 22  GLUCOSE 109*  BUN 17  CREATININE 1.21  CALCIUM 9.6   GFR: CrCl cannot be calculated (Unknown ideal weight.). Liver Function Tests: Recent Labs  Lab 05/06/24 2252  AST 20  ALT 17  ALKPHOS 65  BILITOT 0.8  PROT 7.4  ALBUMIN 4.5   Recent Labs  Lab 05/06/24 2252  LIPASE 22   No results for input(s): AMMONIA in the last 168 hours. Coagulation Profile: No results for input(s): INR, PROTIME in the last 168 hours. Cardiac Enzymes: No results for input(s): CKTOTAL, CKMB, CKMBINDEX, TROPONINI in the last 168 hours. BNP (last 3 results) No results for input(s): PROBNP in the last 8760 hours. HbA1C: No results for input(s): HGBA1C in the last 72 hours. CBG: No results for input(s): GLUCAP in the last 168 hours. Lipid Profile: No results for input(s): CHOL, HDL, LDLCALC, TRIG, CHOLHDL, LDLDIRECT in the last 72 hours. Thyroid Function Tests: No results for input(s): TSH, T4TOTAL, FREET4, T3FREE, THYROIDAB in the last 72 hours. Anemia Panel: No results for input(s): VITAMINB12, FOLATE, FERRITIN, TIBC, IRON, RETICCTPCT in the last 72 hours. Urine analysis:    Component Value Date/Time   COLORURINE PENDING 05/07/2024 0118   APPEARANCEUR CLOUDY (A) 05/07/2024 0118   LABSPEC 1.026 05/07/2024 0118   PHURINE 5.0 05/07/2024 0118   GLUCOSEU NEGATIVE 05/07/2024 0118   HGBUR LARGE (A) 05/07/2024 0118   BILIRUBINUR NEGATIVE 05/07/2024 0118   KETONESUR NEGATIVE 05/07/2024 0118   PROTEINUR 100 (A) 05/07/2024 0118   NITRITE NEGATIVE 05/07/2024 0118   LEUKOCYTESUR NEGATIVE 05/07/2024 0118   Sepsis Labs: @LABRCNTIP (procalcitonin:4,lacticidven:4) )No results found for this or any previous visit (from the past 240 hours).   Radiological Exams on Admission: CT Renal Stone  Study Result Date: 05/07/2024 CLINICAL DATA:  Abdominal, flank pain. Left flank pain, nephrolithiasis EXAM: CT ABDOMEN AND PELVIS WITHOUT CONTRAST TECHNIQUE: Multidetector CT imaging of the abdomen and pelvis was performed following the standard protocol without IV contrast. RADIATION DOSE REDUCTION: This exam was performed according to the departmental dose-optimization program which includes automated exposure control, adjustment of the mA and/or kV according to patient size and/or use of iterative reconstruction technique. COMPARISON:  None Available. FINDINGS: Lower chest: No acute abnormality. Hepatobiliary: No focal liver abnormality is seen. No gallstones, gallbladder wall thickening, or biliary dilatation. Pancreas: Unremarkable Spleen: Unremarkable Adrenals/Urinary Tract: The adrenal glands are unremarkable. The kidneys are normal in size and position. Mild left hydronephrosis and perinephric stranding secondary to an obstructing 3 x 5 mm calculus with left ureteropelvic junction. 2 mm nonobstructing calculus within the pole the right kidney. No hydronephrosis on the right. No additional renal or ureteral calculi are identified. No perinephric fluid collections. Bladder  unremarkable. Stomach/Bowel: Stomach is within normal limits. Appendix appears normal. No evidence of bowel wall thickening, distention, or inflammatory changes. Vascular/Lymphatic: Aortic atherosclerosis. No enlarged abdominal or pelvic lymph nodes. Reproductive: Prostate is unremarkable. Other: No abdominal wall hernia or abnormality. No abdominopelvic ascites. 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 No acute bone abnormality. No lytic or blastic bone lesion. IMPRESSION: 1. Obstructing 3 x 5 mm calculus at the left ureteropelvic junction resulting in mild left hydronephrosis and perinephric stranding. 2. Minimal right nonobstructing nephrolithiasis. 3. 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 4. Aortic atherosclerosis. Aortic Atherosclerosis (ICD10-I70.0). Electronically Signed   By: Dorethia Molt M.D.   On: 05/07/2024 00:01    EKG: Independently reviewed. Sinus tachycardia, rate 102.   Assessment/Plan  1. Obstructing left UPJ stone  - Urology was consulted by ED PA, urine was sent for culture, and patient was started on Rocephin , IVF, and analgesics  - Continue NPO in anticipation of ureteral stent placement, continue Rocephin  and supportive care, follow culture and clinical course      DVT prophylaxis: SCDs  Code Status: Full  Level of Care: Level of care: Telemetry Family Communication: none present  Disposition Plan Patient is from: home Anticipated d/c is to: home  Anticipated d/c date is: 9/6 or 05/09/24  Patient currently: pending treatment of obstructing ureteral stone Consults called: Urology  Admission status: Observation     Evalene GORMAN Sprinkles, MD [facility]  05/07/2024, 4:52 AM

## 2024-05-07 NOTE — Anesthesia Procedure Notes (Signed)
 Procedure Name: LMA Insertion Date/Time: 05/07/2024 7:05 AM  Performed by: Nada Corean CROME, CRNAPre-anesthesia Checklist: Emergency Drugs available, Patient identified, Suction available, Patient being monitored and Timeout performed Patient Re-evaluated:Patient Re-evaluated prior to induction Oxygen Delivery Method: Circle system utilized Preoxygenation: Pre-oxygenation with 100% oxygen Induction Type: IV induction Ventilation: Mask ventilation without difficulty LMA: LMA inserted LMA Size: 4.0 Tube type: Oral Placement Confirmation: positive ETCO2 Tube secured with: Tape Dental Injury: Teeth and Oropharynx as per pre-operative assessment

## 2024-05-08 ENCOUNTER — Encounter (HOSPITAL_COMMUNITY): Payer: Self-pay | Admitting: Urology

## 2024-05-08 ENCOUNTER — Other Ambulatory Visit (HOSPITAL_COMMUNITY): Payer: Self-pay

## 2024-05-08 DIAGNOSIS — E663 Overweight: Secondary | ICD-10-CM | POA: Diagnosis not present

## 2024-05-08 DIAGNOSIS — D72829 Elevated white blood cell count, unspecified: Secondary | ICD-10-CM | POA: Diagnosis not present

## 2024-05-08 DIAGNOSIS — N135 Crossing vessel and stricture of ureter without hydronephrosis: Secondary | ICD-10-CM | POA: Diagnosis not present

## 2024-05-08 LAB — CBC WITH DIFFERENTIAL/PLATELET
Abs Immature Granulocytes: 0.12 K/uL — ABNORMAL HIGH (ref 0.00–0.07)
Abs Immature Granulocytes: 0.09 K/uL — ABNORMAL HIGH (ref 0.00–0.07)
Basophils Absolute: 0 K/uL (ref 0.0–0.1)
Basophils Absolute: 0.1 K/uL (ref 0.0–0.1)
Basophils Relative: 0 %
Basophils Relative: 0 %
Eosinophils Absolute: 0 K/uL (ref 0.0–0.5)
Eosinophils Absolute: 0.1 K/uL (ref 0.0–0.5)
Eosinophils Relative: 0 %
Eosinophils Relative: 1 %
HCT: 44.4 % (ref 39.0–52.0)
HCT: 45.1 % (ref 39.0–52.0)
Hemoglobin: 14.4 g/dL (ref 13.0–17.0)
Hemoglobin: 14.8 g/dL (ref 13.0–17.0)
Immature Granulocytes: 1 %
Immature Granulocytes: 1 %
Lymphocytes Relative: 13 %
Lymphocytes Relative: 19 %
Lymphs Abs: 2.5 K/uL (ref 0.7–4.0)
Lymphs Abs: 3.5 K/uL (ref 0.7–4.0)
MCH: 29.9 pg (ref 26.0–34.0)
MCH: 30.6 pg (ref 26.0–34.0)
MCHC: 32.4 g/dL (ref 30.0–36.0)
MCHC: 32.8 g/dL (ref 30.0–36.0)
MCV: 92.3 fL (ref 80.0–100.0)
MCV: 93.2 fL (ref 80.0–100.0)
Monocytes Absolute: 1.6 K/uL — ABNORMAL HIGH (ref 0.1–1.0)
Monocytes Absolute: 1.2 K/uL — ABNORMAL HIGH (ref 0.1–1.0)
Monocytes Relative: 8 %
Monocytes Relative: 7 %
Neutro Abs: 15.7 K/uL — ABNORMAL HIGH (ref 1.7–7.7)
Neutro Abs: 13.1 K/uL — ABNORMAL HIGH (ref 1.7–7.7)
Neutrophils Relative %: 78 %
Neutrophils Relative %: 72 %
Platelets: 337 K/uL (ref 150–400)
Platelets: 319 K/uL (ref 150–400)
RBC: 4.81 MIL/uL (ref 4.22–5.81)
RBC: 4.84 MIL/uL (ref 4.22–5.81)
RDW: 13.9 % (ref 11.5–15.5)
RDW: 14.2 % (ref 11.5–15.5)
WBC: 20 K/uL — ABNORMAL HIGH (ref 4.0–10.5)
WBC: 18.1 K/uL — ABNORMAL HIGH (ref 4.0–10.5)
nRBC: 0 % (ref 0.0–0.2)
nRBC: 0 % (ref 0.0–0.2)

## 2024-05-08 LAB — COMPREHENSIVE METABOLIC PANEL WITH GFR
ALT: 12 U/L (ref 0–44)
AST: 13 U/L — ABNORMAL LOW (ref 15–41)
Albumin: 3.8 g/dL (ref 3.5–5.0)
Alkaline Phosphatase: 60 U/L (ref 38–126)
Anion gap: 11 (ref 5–15)
BUN: 15 mg/dL (ref 6–20)
CO2: 23 mmol/L (ref 22–32)
Calcium: 9.1 mg/dL (ref 8.9–10.3)
Chloride: 106 mmol/L (ref 98–111)
Creatinine, Ser: 0.95 mg/dL (ref 0.61–1.24)
GFR, Estimated: >60 mL/min (ref >60)
Glucose, Bld: 110 mg/dL — ABNORMAL HIGH (ref 70–99)
Potassium: 4.3 mmol/L (ref 3.5–5.1)
Sodium: 139 mmol/L (ref 135–145)
Total Bilirubin: 0.6 mg/dL (ref 0.0–1.2)
Total Protein: 6.3 g/dL — ABNORMAL LOW (ref 6.5–8.1)

## 2024-05-08 LAB — PHOSPHORUS: Phosphorus: 3.3 mg/dL (ref 2.5–4.6)

## 2024-05-08 LAB — URINE CULTURE
Culture: <10000 — AB
Culture: NO GROWTH

## 2024-05-08 LAB — MAGNESIUM: Magnesium: 2.5 mg/dL — ABNORMAL HIGH (ref 1.7–2.4)

## 2024-05-08 MED ORDER — POLYETHYLENE GLYCOL 3350 17 GM/SCOOP PO POWD
17.0000 g | Freq: Every day | ORAL | 0 refills | Status: AC | PRN
Start: 2024-05-08 — End: ?
  Filled 2024-05-08: qty 238, 14d supply, fill #0

## 2024-05-08 MED ORDER — FLUCONAZOLE 200 MG PO TABS
200.0000 mg | ORAL_TABLET | Freq: Every day | ORAL | 0 refills | Status: AC
  Filled 2024-05-08: qty 12, 12d supply, fill #0

## 2024-05-08 MED ORDER — CIPROFLOXACIN HCL 500 MG PO TABS
500.0000 mg | ORAL_TABLET | Freq: Two times a day (BID) | ORAL | 0 refills | Status: AC
  Filled 2024-05-08: qty 16, 8d supply, fill #0

## 2024-05-08 MED ORDER — ACETAMINOPHEN 325 MG PO TABS
650.0000 mg | ORAL_TABLET | Freq: Four times a day (QID) | ORAL | 0 refills | Status: DC | PRN
  Filled 2024-05-08: qty 20, 3d supply, fill #0

## 2024-05-08 MED ORDER — FLUCONAZOLE 200 MG PO TABS: 200.0000 mg | ORAL_TABLET | Freq: Every day | ORAL | Status: DC

## 2024-05-08 MED ORDER — FLUCONAZOLE 200 MG PO TABS
200.0000 mg | ORAL_TABLET | Freq: Every day | ORAL | Status: DC
  Filled 2024-05-08: qty 1

## 2024-05-08 NOTE — Progress Notes (Signed)
 Urology Inpatient Progress Report  Nephrolithiasis [N20.0] Left flank pain [R10.9] Obstruction of left ureteropelvic junction (UPJ) [N13.5]  Procedure(s): CYSTOURETEROSCOPY, WITH STENT INSERTION, RETROGRADE PYELOGRAM  1 Day Post-Op   Intv/Subj: No acute events overnight. Patient is without complaint. Has some mild pelvic pain and discomfort and urinary frequency/urgency  Principal Problem:   Obstruction of left ureteropelvic junction (UPJ)  Current Facility-Administered Medications  Medication Dose Route Frequency Provider Last Rate Last Admin   acetaminophen  (TYLENOL ) tablet 650 mg  650 mg Oral Q6H PRN Opyd, Timothy S, MD       Or   acetaminophen  (TYLENOL ) suppository 650 mg  650 mg Rectal Q6H PRN Opyd, Timothy S, MD       cefTRIAXone  (ROCEPHIN ) 1 g in sodium chloride  0.9 % 100 mL IVPB  1 g Intravenous Q24H Opyd, Timothy S, MD 200 mL/hr at 05/08/24 0518 1 g at 05/08/24 0518   fluconazole  (DIFLUCAN ) IVPB 200 mg  200 mg Intravenous Q24H Selma Donnice SAUNDERS, MD 100 mL/hr at 05/08/24 0742 200 mg at 05/08/24 9257   HYDROmorphone  (DILAUDID ) injection 0.5-1 mg  0.5-1 mg Intravenous Q3H PRN Opyd, Timothy S, MD   1 mg at 05/08/24 0859   polyethylene glycol (MIRALAX  / GLYCOLAX ) packet 17 g  17 g Oral Daily PRN Opyd, Timothy S, MD       prochlorperazine  (COMPAZINE ) injection 5 mg  5 mg Intravenous Q4H PRN Opyd, Timothy S, MD       sodium chloride  flush (NS) 0.9 % injection 3 mL  3 mL Intravenous Q12H Charlton Evalene RAMAN, MD   3 mL at 05/08/24 0903     Objective: Vital: Vitals:   05/07/24 1758 05/07/24 2121 05/08/24 0542 05/08/24 1306  BP: 136/64 132/69 114/62 132/81  Pulse: 67 66 64 64  Resp: 16 16 16 18   Temp:  97.8 F (36.6 C) 97.8 F (36.6 C) 98 F (36.7 C)  TempSrc:      SpO2:  99% 98% 100%  Weight:      Height:       I/Os: I/O last 3 completed shifts: In: 2140 [P.O.:472; I.V.:468; IV Piggyback:1200] Out: 50 [Urine:50]  Physical Exam:  General: Patient is in no apparent  distress Lungs: Normal respiratory effort, chest expands symmetrically. GI: The abdomen is soft and nontender without mass. Ext: lower extremities symmetric  Lab Results: Recent Labs    05/07/24 0854 05/08/24 0636 05/08/24 1239  WBC 10.7* 20.0* 18.1*  HGB 14.9 14.4 14.8  HCT 46.2 44.4 45.1   Recent Labs    05/06/24 2252 05/07/24 0854 05/08/24 0636  NA 139 138 139  K 4.2 4.5 4.3  CL 105 105 106  CO2 22 25 23   GLUCOSE 109* 99 110*  BUN 17 16 15   CREATININE 1.21 1.14 0.95  CALCIUM 9.6 9.1 9.1   No results for input(s): LABPT, INR in the last 72 hours. No results for input(s): LABURIN in the last 72 hours. Results for orders placed or performed during the hospital encounter of 05/06/24  Urine Culture     Status: Abnormal   Collection Time: 05/07/24  3:49 AM   Specimen: Urine, Clean Catch  Result Value Ref Range Status   Specimen Description   Final    URINE, CLEAN CATCH Performed at [facility], [address] [place], [place] [address]    Special Requests   Final    NONE Performed at [facility], [address] [place], [place] [address]    Culture (  A)  Final    <10,000 COLONIES/mL INSIGNIFICANT GROWTH Performed at [facility], [address], [place], [place] [address]    Report Status 05/08/2024 FINAL  Final  Urine Culture     Status: None   Collection Time: 05/07/24  7:16 AM   Specimen: PATH Cytology Urine  Result Value Ref Range Status   Specimen Description   Final    OTHER path cytology urine, left pelvic urine Performed at [facility], [address], [place], [place] [address]    Special Requests   Final    NONE Performed at [facility], [address] [place], [place] [address]    Culture   Final    NO GROWTH Performed at [facility], [address] [place], [place] [address]    Report Status 05/08/2024 FINAL  Final    Studies/Results: DG  C-Arm 1-60 Min-No Report Result Date: 05/07/2024 Fluoroscopy was utilized by the requesting physician.  No radiographic interpretation.   CT Renal Stone Study Result Date: 05/07/2024 CLINICAL DATA:  Abdominal, flank pain. Left flank pain, nephrolithiasis EXAM: CT ABDOMEN AND PELVIS WITHOUT CONTRAST TECHNIQUE: Multidetector CT imaging of the abdomen and pelvis was performed following the standard protocol without IV contrast. RADIATION DOSE REDUCTION: This exam was performed according to the departmental dose-optimization program which includes automated exposure control, adjustment of the mA and/or kV according to patient size and/or use of iterative reconstruction technique. COMPARISON:  None Available. FINDINGS: Lower chest: No acute abnormality. Hepatobiliary: No focal liver abnormality is seen. No gallstones, gallbladder wall thickening, or biliary dilatation. Pancreas: Unremarkable Spleen: Unremarkable Adrenals/Urinary Tract: The adrenal glands are unremarkable. The kidneys are normal in size and position. Mild left hydronephrosis and perinephric stranding secondary to an obstructing 3 x 5 mm calculus with left ureteropelvic junction. 2 mm nonobstructing calculus within the pole the right kidney. No hydronephrosis on the right. No additional renal or ureteral calculi are identified. No perinephric fluid collections. Bladder unremarkable. Stomach/Bowel: Stomach is within normal limits. Appendix appears normal. No evidence of bowel wall thickening, distention, or inflammatory changes. Vascular/Lymphatic: Aortic atherosclerosis. No enlarged abdominal or pelvic lymph nodes. Reproductive: Prostate is unremarkable. Other: No abdominal wall hernia or abnormality. No abdominopelvic ascites. 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 No acute bone abnormality. No lytic or blastic bone lesion. IMPRESSION: 1. Obstructing 3 x 5 mm calculus at the left ureteropelvic junction resulting  in mild left hydronephrosis and perinephric stranding. 2. Minimal right nonobstructing nephrolithiasis. 3. 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 4. Aortic atherosclerosis. Aortic Atherosclerosis (ICD10-I70.0). Electronically Signed   By: Dorethia Molt M.D.   On: 05/07/2024 00:01    Assessment: Left ureteral calculus Urinary tract infection  Procedure(s): CYSTOURETEROSCOPY, WITH STENT INSERTION, RETROGRADE PYELOGRAM, 1 Day Post-Op  doing well.  Plan: Continue to follow urine cultures and tailor antibiotics appropriately.  If continues to show no growth would switch to an oral antibiotic such as Augmentin or Bactrim to go home with for 7 additional days.  He should follow-up with Dr. Selma outpatient   Sherwood Edison, MD Urology 05/08/2024, 1:21 PM

## 2024-05-08 NOTE — Progress Notes (Signed)
 AVS reviewed w/ pt & s/o who verbalized an understanding. PIV removed as noted. Pt dressing for d/c to home. Discharge meds in a secure bag delivered to pt in room

## 2024-05-08 NOTE — Discharge Summary (Signed)
 Physician Discharge Summary   Patient: Adam Norris MRN: [id number] DOB: [birth year]  Admit date:     05/06/2024  Discharge date: 05/08/24  Discharge Physician: Alejandro Marker, DO   PCP: Pcp, No   Recommendations at discharge:   Follow-up with PCP within 1 to 2 weeks repeat CBC, CMP, mag, Phos within 1 week Follow-up with urology in outpatient setting within 1 to 2 weeks stent removal  Discharge Diagnoses: Principal Problem:   Obstruction of left ureteropelvic junction (UPJ)  Resolved Problems:   * No resolved hospital problems. *  Hospital Course: Adam Norris is a 48 y.o. male with medical history significant for nephrolithiasis who presents with hematuria and left flank pain.  Found to have an obstructing left UPJ stone that was 3 x 5 mm.  Taken for a cystoscopy and left ureteral stent placement and cystoscopy demonstrated no suspicious lesions, masses stones or other pathology.  Currently awaiting cultures and sensitivities to tailor antibiotics.  Urinalysis did show budding yeast so he was initiated on fluconazole  as well.  Culture did not really vomiting and history just to oral antibiotics.  WBC did go up but it is trending back down slowly back down and likely reactive in the setting of his procedure.  Will need to follow-up with PCP and urology in outpatient setting within the next few weeks.  Assessment and Plan:  Hematuria and Left Flank Pain in the setting of Obstructing left UPJ stone: Urology was consulted by ED PA, Urinalysis was done and showed cloudy appearance with brown color urine, large hemoglobin negative ketones, negative leukocytes, many bacteria, WBCs present, greater than 50 RBCs per high-power field and 0-5 WBCs.  Urine was sent for culture and pending.  CT scan was done and demonstrated an obstructing 3 x 5 mm left UPJ stone with mild left hydronephrosis and perinephric inflammatory stranding. Patient was started on IV Ceftriaxone  and will continue. C/w IVF at  100 mL/hr for 10 hours, continue analgesia with acetaminophen  p.o./RC 650 mg p.o. every 6 as needed for mild pain, IV hydromorphone  0.5 to 1 mg every 3 as needed for severe pain.  Continue with antiemetics with prochlorperazine  5 mg IV Q4 For nausea or vomiting.  Given his budding yeast in his urine he was initiated on fluconazole  which will be continued.  Urology took the patient for cystoscopy and left ureteral stent placement.  Subsequently the cystoscopy demonstrated no suspicious lesions, masses, stones or other pathology but the left retrograde pyelogram did demonstrate some mild left hydronephrosis.  Urology recommends 2 weeks of fluconazole  and 1 week of culture specific antibiotics and arranging outpatient follow-up for treatment of his stone and stent removal.  No longer n.p.o. and diet has been advanced.  Urine remains largely clear and yellow-tinged and voiding trial has been done and Foley catheter removed.  Urology feels that he can be discharged from their perspective however will need to follow-up on his culture and sensitivities to tailor antibiotics for discharge. Initial U/A showed 10,000 CFU of Insiginficant Growth and repeat U/A showed No Growth.  It was discussed with ID who recommends transitioning the patient to oral ciprofloxacin  for total 10 days and will also continue fluconazole  200 mg p.o. daily for 14 days.  Urology feels he can be discharged from their perspective he is medically stable for discharge at this time.  Leukocytosis: From Above. WBC went from 12.6 -> 10.7 -> 20.0 -> 18.1. C/w Treatment as above. CTM for S/Sx of Infection. Repeat CBC in the  AM  Overweight: Complicates overall prognosis and care. Estimated body mass index is 27.51 kg/m as calculated from the following:   Height as of this encounter: 5' 11.5 (1.816 m).   Weight as of this encounter: 90.7 kg. Weight Loss and Dietary Counseling given  Consultants: Urology Procedures performed: As delineated as  above Disposition: Home Diet recommendation:  Discharge Diet Orders (From admission, onward)     Start     Ordered   05/08/24 0000  Diet - low sodium heart healthy        05/08/24 1551           Regular diet DISCHARGE MEDICATION: Allergies as of 05/08/2024   No Known Allergies      Medication List     STOP taking these medications    ibuprofen  600 MG tablet Commonly known as: ADVIL        TAKE these medications    acetaminophen  325 MG tablet Commonly known as: TYLENOL  Take 2 tablets (650 mg total) by mouth every 6 (six) hours as needed for mild pain (pain score 1-3) or fever (or Fever >/= 101).   ciprofloxacin  500 MG tablet Commonly known as: Cipro  Take 1 tablet (500 mg total) by mouth 2 (two) times daily for 8 days.   dexamethasone  4 MG tablet Commonly known as: DECADRON  Take 1 tablet (4 mg total) by mouth 2 (two) times daily with a meal.   diclofenac  75 MG EC tablet Commonly known as: VOLTAREN  Take 1 tablet (75 mg total) by mouth 2 (two) times daily.   fluconazole  200 MG tablet Commonly known as: DIFLUCAN  Take 1 tablet (200 mg total) by mouth daily for 12 days.   HYDROcodone -acetaminophen  5-325 MG tablet Commonly known as: NORCO/VICODIN Take 1 tablet by mouth every 4 (four) hours as needed.   L-LYSINE PO Take 500 mg by mouth 2 (two) times daily.   nabumetone  500 MG tablet Commonly known as: RELAFEN  Take 1 tablet (500 mg total) by mouth 2 (two) times daily.   polyethylene glycol powder 17 GM/SCOOP powder Commonly known as: GLYCOLAX /MIRALAX  Take 17 g by mouth daily as needed for mild constipation. Dissolve 1 capful (17g) in 4-8 ounces of liquid and take by mouth daily.        Follow-up Information     Selma Donnice SAUNDERS, MD Follow up.   Specialty: Urology Why: Office will call with an appointment. Contact information: [address] [place] [address] [phone]                Discharge Exam: [name]   05/07/24 0547   Weight: 90.7 kg   Vitals:   05/08/24 0542 05/08/24 1306  BP: 114/62 132/81  Pulse: 64 64  Resp: 16 18  Temp: 97.8 F (36.6 C) 98 F (36.7 C)  SpO2: 98% 100%   Examination: Physical Exam:  Constitutional: WN/WD, overweight Caucasian male in no acute distress Respiratory: Diminished to auscultation bilaterally, no wheezing, rales, rhonchi or crackles. Normal respiratory effort and patient is not tachypenic. No accessory muscle use.  Cardiovascular: RRR, no murmurs / rubs / gallops. S1 and S2 auscultated. No extremity edema.  Abdomen: Soft, a little-tender, tender secondary body habitus. Bowel sounds positive.  GU: Deferred. Musculoskeletal: No clubbing / cyanosis of digits/nails. No joint deformity upper and lower extremities.  Skin: No rashes, lesions, ulcers limited skin evaluation but has multiple tattoos diffusely scattered throughout his body.  Skin is warm and dry Neurologic: CN 2-12 grossly intact with no focal deficits. Romberg  sign and cerebellar reflexes not assessed.  Psychiatric: Normal judgment and insight. Alert and oriented x 3. Normal mood and appropriate affect.   Condition at discharge: stable  The results of significant diagnostics from this hospitalization (including imaging, microbiology, ancillary and laboratory) are listed below for reference.   Imaging Studies: DG C-Arm 1-60 Min-No Report Result Date: 05/07/2024 Fluoroscopy was utilized by the requesting physician.  No radiographic interpretation.   CT Renal Stone Study Result Date: 05/07/2024 CLINICAL DATA:  Abdominal, flank pain. Left flank pain, nephrolithiasis EXAM: CT ABDOMEN AND PELVIS WITHOUT CONTRAST TECHNIQUE: Multidetector CT imaging of the abdomen and pelvis was performed following the standard protocol without IV contrast. RADIATION DOSE REDUCTION: This exam was performed according to the departmental dose-optimization program which includes automated exposure control, adjustment of the mA and/or kV  according to patient size and/or use of iterative reconstruction technique. COMPARISON:  None Available. FINDINGS: Lower chest: No acute abnormality. Hepatobiliary: No focal liver abnormality is seen. No gallstones, gallbladder wall thickening, or biliary dilatation. Pancreas: Unremarkable Spleen: Unremarkable Adrenals/Urinary Tract: The adrenal glands are unremarkable. The kidneys are normal in size and position. Mild left hydronephrosis and perinephric stranding secondary to an obstructing 3 x 5 mm calculus with left ureteropelvic junction. 2 mm nonobstructing calculus within the pole the right kidney. No hydronephrosis on the right. No additional renal or ureteral calculi are identified. No perinephric fluid collections. Bladder unremarkable. Stomach/Bowel: Stomach is within normal limits. Appendix appears normal. No evidence of bowel wall thickening, distention, or inflammatory changes. Vascular/Lymphatic: Aortic atherosclerosis. No enlarged abdominal or pelvic lymph nodes. Reproductive: Prostate is unremarkable. Other: No abdominal wall hernia or abnormality. No abdominopelvic ascites. 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 No acute bone abnormality. No lytic or blastic bone lesion. IMPRESSION: 1. Obstructing 3 x 5 mm calculus at the left ureteropelvic junction resulting in mild left hydronephrosis and perinephric stranding. 2. Minimal right nonobstructing nephrolithiasis. 3. 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 4. Aortic atherosclerosis. Aortic Atherosclerosis (ICD10-I70.0). Electronically Signed   By: Dorethia Molt M.D.   On: 05/07/2024 00:01   Microbiology: Results for orders placed or performed during the hospital encounter of 05/06/24  Urine Culture     Status: Abnormal   Collection Time: 05/07/24  3:49 AM   Specimen: Urine, Clean Catch  Result Value Ref Range Status   Specimen Description   Final    URINE, CLEAN  CATCH Performed at [facility], [address] [place], [place] [address]    Special Requests   Final    NONE Performed at [facility], [address], [place], [place] [address]    Culture (A)  Final    <10,000 COLONIES/mL INSIGNIFICANT GROWTH Performed at [facility], [address] [place], [place] [address]    Report Status 05/08/2024 FINAL  Final  Urine Culture     Status: None   Collection Time: 05/07/24  7:16 AM   Specimen: PATH Cytology Urine  Result Value Ref Range Status   Specimen Description   Final    OTHER path cytology urine, left pelvic urine Performed at [facility], [address] [place], [place] [address]    Special Requests   Final    NONE Performed at [facility], [address] [place], [place] [address]    Culture   Final    NO GROWTH Performed at [facility], [address] [place], [place] [address]  Report Status 05/08/2024 FINAL  Final   Labs: CBC: Recent Labs  Lab 05/06/24 2252 05/07/24 0854 05/08/24 0636 05/08/24 1239  WBC 12.6* 10.7* 20.0* 18.1*  NEUTROABS  --   --  15.7* 13.1*  HGB 15.6 14.9 14.4 14.8  HCT 47.8 46.2 44.4 45.1  MCV 90.0 92.2 92.3 93.2  PLT 351 317 337 319   Basic Metabolic Panel: Recent Labs  Lab 05/06/24 2252 05/07/24 0854 05/08/24 0636  NA 139 138 139  K 4.2 4.5 4.3  CL 105 105 106  CO2 22 25 23   GLUCOSE 109* 99 110*  BUN 17 16 15   CREATININE 1.21 1.14 0.95  CALCIUM 9.6 9.1 9.1  MG  --   --  2.5*  PHOS  --   --  3.3   Liver Function Tests: Recent Labs  Lab 05/06/24 2252 05/08/24 0636  AST 20 13*  ALT 17 12  ALKPHOS 65 60  BILITOT 0.8 0.6  PROT 7.4 6.3*  ALBUMIN 4.5 3.8   CBG: No results for input(s): GLUCAP in the last 168 hours.  Discharge time spent: greater than 30 minutes.  Signed: Alejandro Marker, DO [facility] 05/08/2024

## 2024-05-10 ENCOUNTER — Other Ambulatory Visit: Payer: Self-pay | Admitting: Urology

## 2024-05-20 ENCOUNTER — Encounter (HOSPITAL_COMMUNITY): Payer: Self-pay

## 2024-05-20 NOTE — Patient Instructions (Addendum)
 SURGICAL WAITING ROOM VISITATION  Patients having surgery or a procedure may have no more than 2 support people in the waiting area - these visitors may rotate.    Children under the age of 97 must have an adult with them who is not the patient.  Visitors with respiratory illnesses are discouraged from visiting and should remain at home.  If the patient needs to stay at the hospital during part of their recovery, the visitor guidelines for inpatient rooms apply. Pre-op nurse will coordinate an appropriate time for 1 support person to accompany patient in pre-op.  This support person may not rotate.    Please refer to the [facility] website for the visitor guidelines for Inpatients (after your surgery is over and you are in a regular room).       Your procedure is scheduled on: 05-31-24   Report to [facility] Main Entrance    Report to admitting at     0945  AM   Call this number if you have problems the morning of surgery [phone]   Do not eat food  OR DRINK LIQUIDS:After Midnight.                            If you have questions, please contact your surgeon's office.   FOLLOW  ANY ADDITIONAL PRE OP INSTRUCTIONS YOU RECEIVED FROM YOUR SURGEON'S OFFICE!!!     Oral Hygiene is also important to reduce your risk of infection.                                    Remember - BRUSH YOUR TEETH THE MORNING OF SURGERY WITH YOUR REGULAR TOOTHPASTE  DENTURES WILL BE REMOVED PRIOR TO SURGERY PLEASE DO NOT APPLY Poly grip OR ADHESIVES!!!   Do NOT smoke after Midnight   Stop all vitamins and herbal supplements 7 days before surgery.   Take these medicines the morning of surgery with A SIP OF WATER: oxybutin    Bring CPAP mask and tubing day of surgery.                              You may not have any metal on your body including hair pins, jewelry, and body piercing             Do not wear  lotions, powders, perfumes/cologne, or deodorant                Men may  shave face and neck.   Do not bring valuables to the hospital. [facility] IS NOT             RESPONSIBLE   FOR VALUABLES.   Contacts, glasses, dentures or bridgework may not be worn into surgery.   Bring small overnight bag day of surgery.   DO NOT BRING YOUR HOME MEDICATIONS TO THE HOSPITAL. PHARMACY WILL DISPENSE MEDICATIONS LISTED ON YOUR MEDICATION LIST TO YOU DURING YOUR ADMISSION IN THE HOSPITAL!    Patients discharged on the day of surgery will not be allowed to drive home.  Someone NEEDS to stay with you for the first 24 hours after anesthesia.   Special Instructions: Bring a copy of your healthcare power of attorney and living will documents the day of surgery if you haven't scanned them before.  Please read over the following fact sheets you were given: IF YOU HAVE QUESTIONS ABOUT YOUR PRE-OP INSTRUCTIONS PLEASE CALL [phone].    If you test positive for Covid or have been in contact with anyone that has tested positive in the last 10 days please notify you surgeon.    [facility] - Preparing for Surgery Before surgery, you can play an important role.  Because skin is not sterile, your skin needs to be as free of germs as possible.  You can reduce the number of germs on your skin by washing with CHG (chlorahexidine gluconate) soap before surgery.  CHG is an antiseptic cleaner which kills germs and bonds with the skin to continue killing germs even after washing. Please DO NOT use if you have an allergy to CHG or antibacterial soaps.  If your skin becomes reddened/irritated stop using the CHG and inform your nurse when you arrive at Short Stay. Do not shave (including legs and underarms) for at least 48 hours prior to the first CHG shower.  You may shave your face/neck. Please follow these instructions carefully:  1.  Shower with CHG Soap the night before surgery and the  morning of Surgery.  2.  If you choose to wash your hair, wash your hair first as usual with your   normal  shampoo.  3.  After you shampoo, rinse your hair and body thoroughly to remove the  shampoo.                            4.  Use CHG as you would any other liquid soap.  You can apply chg directly  to the skin and wash                       Gently with a scrungie or clean washcloth.  5.  Apply the CHG Soap to your body ONLY FROM THE NECK DOWN.   Do not use on face/ open                           Wound or open sores. Avoid contact with eyes, ears mouth and genitals (private parts).                       Wash face,  Genitals (private parts) with your normal soap.             6.  Wash thoroughly, paying special attention to the area where your surgery  will be performed.  7.  Thoroughly rinse your body with warm water from the neck down.  8.  DO NOT shower/wash with your normal soap after using and rinsing off  the CHG Soap.                9.  Pat yourself dry with a clean towel.            10.  Wear clean pajamas.            11.  Place clean sheets on your bed the night of your first shower and do not  sleep with pets. Day of Surgery : Do not apply any lotions/deodorants the morning of surgery.  Please wear clean clothes to the hospital/surgery center.  FAILURE TO FOLLOW THESE INSTRUCTIONS MAY RESULT IN THE CANCELLATION OF YOUR SURGERY PATIENT SIGNATURE_________________________________  NURSE SIGNATURE__________________________________  ________________________________________________________________________

## 2024-05-20 NOTE — Progress Notes (Addendum)
 PCP - Blanche Greaves , MD Cardiologist - n/a  PPM/ICD -  Device Orders -  Rep Notified -   Chest x-ray -  EKG -  Stress Test -  ECHO -  Cardiac Cath -   Sleep Study -  CPAP -   Fasting Blood Sugar -  Checks Blood Sugar __n/a___ times a day  Blood Thinner Instructions:n/a Aspirin Instructions:n/a  ERAS Protcol - PRE-SURGERY n/a   COVID vaccine -yes  Activity--Able to climb a flight of stairs with no CP or SOB Anesthesia review:   Patient denies shortness of breath, fever, cough and chest pain at PAT appointment   All instructions explained to the patient, with a verbal understanding of the material. Patient agrees to go over the instructions while at home for a better understanding. Patient also instructed to self quarantine after being tested for COVID-19. The opportunity to ask questions was provided.

## 2024-05-26 ENCOUNTER — Encounter (HOSPITAL_COMMUNITY)
Admission: RE | Admit: 2024-05-26 | Discharge: 2024-05-26 | Disposition: A | Discharge status: Home / Self Care | Source: Ambulatory Visit | Attending: Urology | Admitting: Urology

## 2024-05-26 ENCOUNTER — Encounter (HOSPITAL_COMMUNITY): Payer: Self-pay

## 2024-05-26 ENCOUNTER — Other Ambulatory Visit: Payer: Self-pay

## 2024-05-26 VITALS — BP 144/88 | HR 60 | Temp 98.2°F | Resp 16 | Ht 71.0 in | Wt 200.0 lb

## 2024-05-26 DIAGNOSIS — Z01812 Encounter for preprocedural laboratory examination: Secondary | ICD-10-CM | POA: Diagnosis present

## 2024-05-26 DIAGNOSIS — Z01818 Encounter for other preprocedural examination: Secondary | ICD-10-CM

## 2024-05-26 HISTORY — DX: Personal history of urinary calculi: Z87.442

## 2024-05-26 HISTORY — DX: Gastro-esophageal reflux disease without esophagitis: K21.9

## 2024-05-26 LAB — CBC
HCT: 46.3 % (ref 39.0–52.0)
Hemoglobin: 14.8 g/dL (ref 13.0–17.0)
MCH: 29.1 pg (ref 26.0–34.0)
MCHC: 32 g/dL (ref 30.0–36.0)
MCV: 91 fL (ref 80.0–100.0)
Platelets: 373 K/uL (ref 150–400)
RBC: 5.09 MIL/uL (ref 4.22–5.81)
RDW: 13.4 % (ref 11.5–15.5)
WBC: 8.2 K/uL (ref 4.0–10.5)
nRBC: 0 % (ref 0.0–0.2)

## 2024-05-31 ENCOUNTER — Ambulatory Visit (HOSPITAL_COMMUNITY): Payer: Self-pay | Admitting: Physician Assistant

## 2024-05-31 ENCOUNTER — Ambulatory Visit (HOSPITAL_COMMUNITY)
Admission: RE | Admit: 2024-05-31 | Discharge: 2024-05-31 | Disposition: A | Discharge status: Home / Self Care | Attending: Urology | Admitting: Urology

## 2024-05-31 ENCOUNTER — Encounter (HOSPITAL_COMMUNITY)
Admission: RE | Disposition: A | Discharge status: Home / Self Care | Payer: Self-pay | Source: Home / Self Care | Attending: Urology

## 2024-05-31 ENCOUNTER — Other Ambulatory Visit: Payer: Self-pay

## 2024-05-31 ENCOUNTER — Ambulatory Visit (HOSPITAL_COMMUNITY): Admitting: Anesthesiology

## 2024-05-31 ENCOUNTER — Ambulatory Visit (HOSPITAL_COMMUNITY)

## 2024-05-31 ENCOUNTER — Other Ambulatory Visit (HOSPITAL_COMMUNITY): Payer: Self-pay

## 2024-05-31 ENCOUNTER — Encounter (HOSPITAL_COMMUNITY): Payer: Self-pay | Admitting: Urology

## 2024-05-31 DIAGNOSIS — N201 Calculus of ureter: Secondary | ICD-10-CM | POA: Diagnosis present

## 2024-05-31 DIAGNOSIS — F17210 Nicotine dependence, cigarettes, uncomplicated: Secondary | ICD-10-CM | POA: Diagnosis not present

## 2024-05-31 DIAGNOSIS — N135 Crossing vessel and stricture of ureter without hydronephrosis: Secondary | ICD-10-CM

## 2024-05-31 HISTORY — PX: CYSTOSCOPY W/ RETROGRADES: SHX1426

## 2024-05-31 HISTORY — PX: CYSTOSCOPY/URETEROSCOPY/HOLMIUM LASER/STENT PLACEMENT: SHX6546

## 2024-05-31 SURGERY — CYSTOSCOPY/URETEROSCOPY/HOLMIUM LASER/STENT PLACEMENT
Anesthesia: General | Site: Pelvis | Laterality: Left

## 2024-05-31 MED ORDER — 0.9 % SODIUM CHLORIDE (POUR BTL) OPTIME
TOPICAL | Status: DC | PRN
  Administered 2024-05-31: 1000 mL

## 2024-05-31 MED ORDER — EPHEDRINE 5 MG/ML INJ
INTRAVENOUS | Status: AC
  Filled 2024-05-31: qty 5

## 2024-05-31 MED ORDER — FENTANYL CITRATE PF 50 MCG/ML IJ SOSY
PREFILLED_SYRINGE | INTRAMUSCULAR | Status: AC
  Filled 2024-05-31: qty 1

## 2024-05-31 MED ORDER — TAMSULOSIN HCL 0.4 MG PO CAPS
0.4000 mg | ORAL_CAPSULE | Freq: Every day | ORAL | 1 refills | Status: AC
  Filled 2024-05-31: qty 30, 30d supply, fill #0

## 2024-05-31 MED ORDER — ACETAMINOPHEN 10 MG/ML IV SOLN
INTRAVENOUS | Status: AC
  Filled 2024-05-31: qty 100

## 2024-05-31 MED ORDER — DEXAMETHASONE SODIUM PHOSPHATE 4 MG/ML IJ SOLN
INTRAMUSCULAR | Status: DC | PRN
  Administered 2024-05-31: 10 mg via INTRAVENOUS

## 2024-05-31 MED ORDER — PROPOFOL 10 MG/ML IV BOLUS
INTRAVENOUS | Status: AC
  Filled 2024-05-31: qty 20

## 2024-05-31 MED ORDER — AMISULPRIDE (ANTIEMETIC) 5 MG/2ML IV SOLN: 10.0000 mg | Freq: Once | INTRAVENOUS | Status: DC | PRN

## 2024-05-31 MED ORDER — FENTANYL CITRATE PF 50 MCG/ML IJ SOSY
25.0000 ug | PREFILLED_SYRINGE | INTRAMUSCULAR | Status: DC | PRN
  Administered 2024-05-31: 50 ug via INTRAVENOUS

## 2024-05-31 MED ORDER — MIDAZOLAM HCL 5 MG/5ML IJ SOLN
INTRAMUSCULAR | Status: DC | PRN
  Administered 2024-05-31: 2 mg via INTRAVENOUS

## 2024-05-31 MED ORDER — PROPOFOL 10 MG/ML IV BOLUS
INTRAVENOUS | Status: DC | PRN
  Administered 2024-05-31: 200 mg via INTRAVENOUS

## 2024-05-31 MED ORDER — LIDOCAINE HCL (PF) 2 % IJ SOLN
INTRAMUSCULAR | Status: AC
  Filled 2024-05-31: qty 5

## 2024-05-31 MED ORDER — OXYCODONE-ACETAMINOPHEN 5-325 MG PO TABS
1.0000 | ORAL_TABLET | ORAL | 0 refills | Status: AC | PRN
  Filled 2024-05-31: qty 18, 3d supply, fill #0

## 2024-05-31 MED ORDER — ONDANSETRON HCL 4 MG/2ML IJ SOLN
INTRAMUSCULAR | Status: AC
  Filled 2024-05-31: qty 2

## 2024-05-31 MED ORDER — LIDOCAINE HCL (CARDIAC) PF 100 MG/5ML IV SOSY
PREFILLED_SYRINGE | INTRAVENOUS | Status: DC | PRN
  Administered 2024-05-31: 60 mg via INTRAVENOUS

## 2024-05-31 MED ORDER — FENTANYL CITRATE (PF) 100 MCG/2ML IJ SOLN
INTRAMUSCULAR | Status: AC
  Filled 2024-05-31: qty 2

## 2024-05-31 MED ORDER — OXYCODONE HCL 5 MG PO TABS: 5.0000 mg | ORAL_TABLET | Freq: Once | ORAL | Status: DC | PRN

## 2024-05-31 MED ORDER — ONDANSETRON HCL 4 MG/2ML IJ SOLN
INTRAMUSCULAR | Status: DC | PRN
  Administered 2024-05-31: 4 mg via INTRAVENOUS

## 2024-05-31 MED ORDER — LACTATED RINGERS IV SOLN: INTRAVENOUS | Status: DC

## 2024-05-31 MED ORDER — HYDROMORPHONE HCL 1 MG/ML IJ SOLN
INTRAMUSCULAR | Status: DC | PRN
  Administered 2024-05-31: .5 mg via INTRAVENOUS

## 2024-05-31 MED ORDER — CHLORHEXIDINE GLUCONATE 0.12 % MT SOLN
15.0000 mL | Freq: Once | OROMUCOSAL | Status: AC
  Administered 2024-05-31: 15 mL via OROMUCOSAL

## 2024-05-31 MED ORDER — ACETAMINOPHEN 10 MG/ML IV SOLN
1000.0000 mg | Freq: Once | INTRAVENOUS | Status: DC | PRN
  Administered 2024-05-31: 1000 mg via INTRAVENOUS

## 2024-05-31 MED ORDER — MIDAZOLAM HCL 2 MG/2ML IJ SOLN
INTRAMUSCULAR | Status: AC
  Filled 2024-05-31: qty 2

## 2024-05-31 MED ORDER — HYDROMORPHONE HCL 2 MG/ML IJ SOLN
INTRAMUSCULAR | Status: AC
  Filled 2024-05-31: qty 1

## 2024-05-31 MED ORDER — PHENYLEPHRINE 80 MCG/ML (10ML) SYRINGE FOR IV PUSH (FOR BLOOD PRESSURE SUPPORT)
PREFILLED_SYRINGE | INTRAVENOUS | Status: AC
  Filled 2024-05-31: qty 10

## 2024-05-31 MED ORDER — DOCUSATE SODIUM 100 MG PO CAPS
100.0000 mg | ORAL_CAPSULE | Freq: Every day | ORAL | 1 refills | Status: AC | PRN
  Filled 2024-05-31: qty 30, 30d supply, fill #0

## 2024-05-31 MED ORDER — IOHEXOL 300 MG/ML  SOLN
INTRAMUSCULAR | Status: DC | PRN
  Administered 2024-05-31: 7 mL

## 2024-05-31 MED ORDER — SODIUM CHLORIDE 0.9 % IR SOLN
Status: DC | PRN
  Administered 2024-05-31: 3000 mL via INTRAVESICAL

## 2024-05-31 MED ORDER — ORAL CARE MOUTH RINSE: 15.0000 mL | Freq: Once | OROMUCOSAL | Status: AC

## 2024-05-31 MED ORDER — DEXAMETHASONE SODIUM PHOSPHATE 10 MG/ML IJ SOLN
INTRAMUSCULAR | Status: AC
  Filled 2024-05-31: qty 1

## 2024-05-31 MED ORDER — FENTANYL CITRATE (PF) 100 MCG/2ML IJ SOLN
INTRAMUSCULAR | Status: DC | PRN
  Administered 2024-05-31 (×5): 25 ug via INTRAVENOUS
  Administered 2024-05-31: 50 ug via INTRAVENOUS
  Administered 2024-05-31: 25 ug via INTRAVENOUS

## 2024-05-31 MED ORDER — OXYCODONE HCL 5 MG/5ML PO SOLN: 5.0000 mg | Freq: Once | ORAL | Status: DC | PRN

## 2024-05-31 MED ORDER — CEFAZOLIN SODIUM-DEXTROSE 2-4 GM/100ML-% IV SOLN
2.0000 g | INTRAVENOUS | Status: AC
  Administered 2024-05-31: 2 g via INTRAVENOUS
  Filled 2024-05-31: qty 100

## 2024-05-31 MED ORDER — ESMOLOL HCL 100 MG/10ML IV SOLN
INTRAVENOUS | Status: AC
  Filled 2024-05-31: qty 10

## 2024-05-31 SURGICAL SUPPLY — 24 items
BAG URO CATCHER STRL LF (MISCELLANEOUS) ×1 IMPLANT
BASKET ZERO TIP NITINOL 2.4FR (BASKET) IMPLANT
BENZOIN TINCTURE PRP APPL 2/3 (GAUZE/BANDAGES/DRESSINGS) IMPLANT
CATH URETERAL DUAL LUMEN 10F (MISCELLANEOUS) IMPLANT
CATH URETL OPEN 5X70 (CATHETERS) ×1 IMPLANT
CLOTH BEACON ORANGE TIMEOUT ST (SAFETY) ×1 IMPLANT
DRSG TEGADERM 2-3/8X2-3/4 SM (GAUZE/BANDAGES/DRESSINGS) IMPLANT
FIBER LASER MOSES 200 DFL (Laser) IMPLANT
GLOVE BIOGEL M 7.0 STRL (GLOVE) ×1 IMPLANT
GOWN STRL REUS W/ TWL XL LVL3 (GOWN DISPOSABLE) ×1 IMPLANT
GUIDEWIRE STR DUAL SENSOR (WIRE) ×2 IMPLANT
GUIDEWIRE ZIPWRE .038 STRAIGHT (WIRE) IMPLANT
KIT TURNOVER KIT A (KITS) ×1 IMPLANT
MANIFOLD NEPTUNE II (INSTRUMENTS) ×1 IMPLANT
NS IRRIG 1000ML POUR BTL (IV SOLUTION) IMPLANT
PACK CYSTO (CUSTOM PROCEDURE TRAY) ×1 IMPLANT
PAD PREP 24X48 CUFFED NSTRL (MISCELLANEOUS) ×1 IMPLANT
SHEATH DILATOR SET 8/10 (MISCELLANEOUS) IMPLANT
SHEATH NAVIGATOR HD 11/13X28 (SHEATH) IMPLANT
SHEATH NAVIGATOR HD 11/13X36 (SHEATH) IMPLANT
STENT URET 6FRX26 CONTOUR (STENTS) IMPLANT
TRACTIP FLEXIVA PULS ID 200XHI (Laser) IMPLANT
TUBING CONNECTING 10 (TUBING) ×1 IMPLANT
TUBING UROLOGY SET (TUBING) ×1 IMPLANT

## 2024-05-31 NOTE — Anesthesia Procedure Notes (Signed)
 Procedure Name: LMA Insertion Date/Time: 05/31/2024 11:53 AM  Performed by: Nada Corean CROME, CRNAPre-anesthesia Checklist: Emergency Drugs available, Patient identified, Suction available, Patient being monitored and Timeout performed Patient Re-evaluated:Patient Re-evaluated prior to induction Oxygen Delivery Method: Circle system utilized Preoxygenation: Pre-oxygenation with 100% oxygen Induction Type: IV induction Ventilation: Mask ventilation without difficulty LMA: LMA inserted LMA Size: 5.0 Tube type: Oral Number of attempts: 1 Placement Confirmation: positive ETCO2 and breath sounds checked- equal and bilateral Tube secured with: Tape Dental Injury: Teeth and Oropharynx as per pre-operative assessment

## 2024-05-31 NOTE — H&P (Signed)
 Office Visit Report     05/20/2024   --------------------------------------------------------------------------------   Atwell Mcdanel  MRN: [id number]  DOB: Jan 25, 1976, 48 year old Male  SSN:    PRIMARY CARE:     REFERRING:    PROVIDER:  Donnice Siad, M.D.  LOCATION:  [facility] - [phone]     --------------------------------------------------------------------------------   CC/HPI: Adam Norris is a 48 year old male who is seen in follow with history of urolithiasis.   1. Urolithiasis:  Previous history of urolithiasis and underwent ESWL and ureteroscopy.  - Presented the ED in 9/25 with left-sided flank pain found to be afebrile, leukocytosis, many bacteria and budding yeast present. CT A/P 05/07/2024 revealed 3 x 5 mm stone in the left ureteropelvic junction with mild left hydronephrosis. Also present 2 mm stone in the right kidney.  Underwent left ureteral stent placement on 05/07/2024.  -Urine culture 05/07/2024 revealed no growth.  Patient is doing well denies significant pain, fevers, chills.   He does complain of urinary urgency, frequency, weaker flow stream.     ALLERGIES: None   MEDICATIONS: None   GU PSH: None   NON-GU PSH: None   GU PMH: None   NON-GU PMH: None   FAMILY HISTORY: No Family History    SOCIAL HISTORY: Marital Status: Single Current Smoking Status: Patient smokes. Has smoked since 05/03/1994. Smokes 1 pack per day.   Tobacco Use Assessment Completed: Used Tobacco in last 30 days?    REVIEW OF SYSTEMS:    GU Review Male:   Patient reports frequent urination, hard to postpone urination, burning/ pain with urination, get up at night to urinate, leakage of urine, trouble starting your stream, and have to strain to urinate . Patient denies stream starts and stops, erection problems, and penile pain.  Gastrointestinal (Upper):   Patient denies nausea, vomiting, and indigestion/ heartburn.  Gastrointestinal (Lower):   Patient denies  diarrhea and constipation.  Constitutional:   Patient denies fever, night sweats, weight loss, and fatigue.  Skin:   Patient denies skin rash/ lesion and itching.  Eyes:   Patient denies blurred vision and double vision.  Ears/ Nose/ Throat:   Patient denies sore throat and sinus problems.  Hematologic/Lymphatic:   Patient denies swollen glands and easy bruising.  Cardiovascular:   Patient denies leg swelling and chest pains.  Respiratory:   Patient denies cough and shortness of breath.  Endocrine:   Patient denies excessive thirst.  Musculoskeletal:   Patient denies back pain and joint pain.  Neurological:   Patient denies headaches and dizziness.  Psychologic:   Patient denies depression and anxiety.   VITAL SIGNS:      05/20/2024 03:36 PM  Weight 198 lb / 89.81 kg  Height 71 in / 180.34 cm  BP 112/81 mmHg  Pulse 91 /min  Temperature 98.3 F / 36.8 C  BMI 27.6 kg/m   MULTI-SYSTEM PHYSICAL EXAMINATION:    Constitutional: Well-nourished. No physical deformities. Normally developed. Good grooming.  Respiratory: No labored breathing, no use of accessory muscles.   Cardiovascular: Normal temperature, normal extremity pulses, no swelling, no varicosities.  Gastrointestinal: No mass, no tenderness, no rigidity, non obese abdomen.     Complexity of Data:  Source Of History:  Patient, Medical Record Summary  Records Review:   Previous Doctor Records, Previous Hospital Records, Previous Patient Records  Urine Test Review:   Urinalysis  X-Ray Review: C.T. Abdomen/Pelvis: Reviewed Films. Reviewed Report. Discussed With Patient.     PROCEDURES:  Visit Complexity - G2211          Urinalysis w/Scope Dipstick Dipstick Cont'd Micro  Color: Red Bilirubin: Neg mg/dL WBC/hpf: 6 - 89/yeq  Appearance: Turbid Ketones: Trace mg/dL RBC/hpf: >39/yeq  Specific Gravity: 1.025 Blood: 3+ ery/uL Bacteria: Rare (0-9/hpf)  pH: 6.5 Protein: 3+ mg/dL Cystals: NS (Not Seen)  Glucose: Neg mg/dL  Urobilinogen: 0.2 mg/dL Casts: NS (Not Seen)    Nitrites: Neg Trichomonas: Not Present    Leukocyte Esterase: 2+ leu/uL Mucous: Not Present      Epithelial Cells: NS (Not Seen)      Yeast: NS (Not Seen)      Sperm: Not Present    Notes: too bloody to spin    ASSESSMENT:      ICD-10 Details  1 GU:   Ureteral calculus - N20.1    PLAN:            Medications New Meds: oxyBUTYnin Chloride 5 MG Tablet 1 tablet PO TID   #45  1 Refill(s)  Tamsulosin HCl 0.4 MG Capsule 1 capsule PO Q HS   #30  1 Refill(s)  Pyridium 100 MG Tablet 1 tablet PO TID PRN   #30  1 Refill(s)  Pharmacy Name:  [id number]  Address:  [address]   [place], [place] [address]  Phone:  [phone]  Fax:  [phone]            Orders Labs Urine Culture          Schedule Return Visit/Planned Activity: Keep Scheduled Appointment - Schedule Surgery          Document Letter(s):  Created for Patient: Clinical Summary         Notes:    1. Urolithiasis:  - S/p left ureteral stent for 5 mm left UPJ stone in 9/25.  - He completed 2 weeks of fluconazole  and antibiotics.  - He is scheduled for left ureteroscopy with laser lithotripsy and basket traction of stone on 05/31/2024.  - Will send urine for culture today and treat if positive.  - Will send in tamsulosin, oxybutynin and Pyridium.   We discussed the options for management of kidney stones, including observation, ESWL, ureteroscopy with laser lithotripsy, and PCNL. The risks and benefits of each option were discussed.  For observation I described the risks which include but are not limited to silent renal damage, life-threatening infection, need for emergent surgery, failure to pass stone, and pain.   ESWL: risks and benefits of ESWL were outlined including infection, bleeding, pain, steinstrasse, kidney injury, need for ancillary treatments, and global anesthesia risks including but not limited to CVA, MI, DVT, PE, pneumonia, and death.    Ureteroscopy: risks and benefits of ureteroscopy were outlined, including infection, bleeding, pain, temporary ureteral stent and associated stent bother, ureteral injury, ureteral stricture, need for ancillary treatments, and global anesthesia risks including but not limited to CVA, MI, DVT, PE, pneumonia, and death.   PCNL: risks and benefits of PCNL were outlined including infection, bleeding, blood transfusion, pain, pneumothorax, bowel injury, persistent urine leak, positioning injury, inability to clear stone burden, renal laceration, arterial venous fistula or malformation, need for ancillary treatments, and global anesthesia risks including but not limited to CVA, MI, DVT, PE, pneumonia, and death.   We discussed dietary methods for stone prevention including the following: increased water intake to 2-3 liters per day, add lemon or lemon concentrate to water to increase citrate which is beneficial for stone prevention, limiting dietary sodium  to less than 2000 mg per day, limiting animal protein to less than 2 servings (16 ounces/day), and limiting foods high in oxalate content (spinach, beans, chocolate, etc.).    Urology Preoperative H&P   Chief Complaint: left ureteral stone  History of Present Illness: Adam Norris is a 48 y.o. male with a left ureteral stone here for cysto, L RPG, L URS/LL. Ucx 05/20/2024 NG.   Past Medical History:  Diagnosis Date   GERD (gastroesophageal reflux disease)    History of kidney stones     Past Surgical History:  Procedure Laterality Date   COLONOSCOPY     CYSTOSCOPY WITH URETEROSCOPY AND STENT PLACEMENT Left 05/07/2024   Procedure: CYSTOURETEROSCOPY, WITH STENT INSERTION, RETROGRADE PYELOGRAM;  Surgeon: Selma Donnice SAUNDERS, MD;  Location: [facility];  Service: Urology;  Laterality: Left;   HAND SURGERY Left    VASECTOMY     WRIST FRACTURE SURGERY Left     Allergies: No Known Allergies  History reviewed. No pertinent family history.  Social  History:  reports that he has been smoking cigarettes. He does not have any smokeless tobacco history on file. He reports that he does not currently use alcohol. He reports that he does not currently use drugs after having used the following drugs: Marijuana.  ROS: A complete review of systems was performed.  All systems are negative except for pertinent findings as noted.  Physical Exam:  Vital signs in last 24 hours: Temp:  [97.8 F (36.6 C)] 97.8 F (36.6 C) (09/29 1043) Pulse Rate:  [63] 63 (09/29 1043) Resp:  [16] 16 (09/29 1043) BP: (131)/(86) 131/86 (09/29 1043) SpO2:  [97 %] 97 % (09/29 1043) Weight:  [90.7 kg] 90.7 kg (09/29 1035) Constitutional:  Alert and oriented, No acute distress Cardiovascular: Regular rate and rhythm Respiratory: Normal respiratory effort, Lungs clear bilaterally GI: Abdomen is soft, nontender, nondistended, no abdominal masses GU: No CVA tenderness Lymphatic: No lymphadenopathy Neurologic: Grossly intact, no focal deficits Psychiatric: Normal mood and affect  Laboratory Data:  No results for input(s): WBC, HGB, HCT, PLT in the last 72 hours.  No results for input(s): NA, K, CL, GLUCOSE, BUN, CALCIUM, CREATININE in the last 72 hours.  Invalid input(s): CO3   No results found for this or any previous visit (from the past 24 hours). No results found for this or any previous visit (from the past 240 hours).  Renal Function: No results for input(s): CREATININE in the last 168 hours. CrCl cannot be calculated (Patient's most recent lab result is older than the maximum 21 days allowed.).  Radiologic Imaging: No results found.  I independently reviewed the above imaging studies.  Assessment and Plan STERLIN KNIGHTLY is a 48 y.o. male with left ureteral stone here for cysto, L RPG, L URS/LL.    -The risks, benefits and alternatives of cystoscopy with L URS/LL, L RPG, Left JJ stent placement was discussed with the  patient.  Risks include, but are not limited to: bleeding, urinary tract infection, ureteral injury, ureteral stricture disease, chronic pain, urinary symptoms, bladder injury, stent migration, the need for nephrostomy tube placement, MI, CVA, DVT, PE and the inherent risks with general anesthesia.  The patient voices understanding and wishes to proceed.   Matt R. Jontae Adebayo MD 05/31/2024, 11:15 AM  [facility] Pager: [phone]

## 2024-05-31 NOTE — Anesthesia Postprocedure Evaluation (Signed)
 Anesthesia Post Note  Patient: Adam Norris  Procedure(s) Performed: CYSTOSCOPY/URETEROSCOPY/HOLMIUM LASER/STENT PLACEMENT (Left: Pelvis) CYSTOSCOPY, WITH RETROGRADE PYELOGRAM (Left: Pelvis)     Patient location during evaluation: PACU Anesthesia Type: General Level of consciousness: awake Pain management: pain level controlled Vital Signs Assessment: post-procedure vital signs reviewed and stable Respiratory status: spontaneous breathing, nonlabored ventilation and respiratory function stable Cardiovascular status: blood pressure returned to baseline and stable Postop Assessment: no apparent nausea or vomiting Anesthetic complications: no   No notable events documented.  Last Vitals:  Vitals:   05/31/24 1400 05/31/24 1411  BP: (!) 141/81 129/79  Pulse: (!) 59 (!) 56  Resp: 11 13  Temp: 36.6 C 36.6 C  SpO2: 96% 96%    Last Pain:  Vitals:   05/31/24 1411  TempSrc:   PainSc: 3                  Taji Barretto P Tyla Burgner

## 2024-05-31 NOTE — Discharge Instructions (Signed)
[facility] °[phone] °Post Ureteroscopy With or Without Stent Instructions ° °Definitions: ° °Ureter: The duct that transports urine from the kidney to the bladder. °Stent:   A plastic hollow tube that is placed into the ureter, from the kidney to the                 bladder to prevent the ureter from swelling shut. ° °GENERAL INSTRUCTIONS: ° °Despite the fact that no skin incisions were used, the area around the ureter and bladder is raw and irritated. The stent is a foreign body which will further irritate the bladder wall. This irritation is manifested by increased frequency of urination, both day and night, and by an increase in the urge to urinate. In some, the urge to urinate is present almost always. Sometimes the urge is strong enough that you may not be able to stop yourself from urinating. The only real cure is to remove the stent and then give time for the bladder wall to heal which can't be done until the danger of the ureter swelling shut has passed, which varies. ° °You may see some blood in your urine while the stent is in place and a few days afterwards. Do not be alarmed, even if the urine was clear for a while. Get off your feet and drink lots of fluids until clearing occurs. If you start to pass clots or don't improve, call us. ° °DIET: °You may return to your normal diet immediately. Because of the raw surface of your bladder, alcohol, spicy foods, acid type foods and drinks with caffeine may cause irritation or frequency and should be used in moderation. To keep your urine flowing freely and to avoid constipation, drink plenty of fluids during the day ( 8-10 glasses ). °Tip: Avoid cranberry juice because it is very acidic. ° °ACTIVITY: °Your physical activity doesn't need to be restricted. However, if you are very active, you may see some blood in your urine. We suggest that you reduce your activity under these circumstances until the bleeding has stopped. ° °BOWELS: °It is  important to keep your bowels regular during the postoperative period. Straining with bowel movements can cause bleeding. A bowel movement every other day is reasonable. Use a mild laxative if needed, such as Milk of Magnesia 2-3 tablespoons, or 2 Dulcolax tablets. Call if you continue to have problems. If you have been taking narcotics for pain, before, during or after your surgery, you may be constipated. Take a laxative if necessary. ° ° °MEDICATION: °You should resume your pre-surgery medications unless told not to. In addition you will often be given an antibiotic to prevent infection. These should be taken as prescribed until the bottles are finished unless you are having an unusual reaction to one of the drugs. ° °PROBLEMS YOU SHOULD REPORT TO US: °· Fevers over 100.5 Fahrenheit. °· Heavy bleeding, or clots ( See above notes about blood in urine ). °· Inability to urinate. °· Drug reactions ( hives, rash, nausea, vomiting, diarrhea ). °· Severe burning or pain with urination that is not improving. ° °FOLLOW-UP: °You will need a follow-up appointment to monitor your progress. Call for this appointment at the number listed above. Usually the first appointment will be about three to fourteen days after your surgery. ° ° ° ° ° °

## 2024-05-31 NOTE — Op Note (Signed)
 Operative Note  Preoperative diagnosis:  1.  Left ureteral stone  Postoperative diagnosis: 1.  Left ureteral stone  Procedure(s): 1.  Cystoscopy 2.  Left ureteroscopy with laser lithotripsy and basket extraction of stones 3.  Left retrograde pyelogram 4.  Left ureteral stent exchange 5. Fluoroscopy with intraoperative interpretation  Surgeon: Donnice Siad, MD  Assistants:  None  Anesthesia:  General  Complications:  None  EBL:  Minimal  Specimens: 1. Stones for stone analysis (to be done at [facility])  Drains/Catheters: 1.  Left 6Fr x 26cm ureteral stent without tether string  Intraoperative findings:   Cystoscopy demonstrated no suspicious bladder lesions.  Indwelling left ureteral stent was in appropriate position. Left retrograde pyelogram revealed mild left hydronephrosis, no filling defects, no extravasation of contrast. Left ureteroscopy demonstrated some areas of stenosis along the distal left ureter as well as friability in the distal left ureter and proximal left ureter corresponding to impacted stone site.  Stone was about 3 x 5 mm.  This was dusted and fragmented.  Large fragments were removed.  There was some clot residue present within the ureter and renal pelvis that was also evacuated.  No stone fragments at the end of the case. Successful left ureteral stent stent placement.  Indication:  Adam Norris is a 48 y.o. male with a history of left ureteral stone who underwent left-sided stent placement on 05/07/2024.  Urine culture resulted no growth.  He pleated a course of antibiotics and antifungals.  Description of procedure: After informed consent was obtained from the patient, the patient was identified and taken to the operating room and placed in the supine position.  General anesthesia was administered as well as perioperative IV antibiotics.  At the beginning of the case, a time-out was performed to properly identify the patient, the surgery to be  performed, and the surgical site.  Sequential compression devices were applied to the lower extremities at the beginning of the case for DVT prophylaxis.  The patient was then placed in the dorsal lithotomy supine position, prepped and draped in sterile fashion.  We then passed the 21-French rigid cystoscope through the urethra and into the bladder under vision without any difficulty, noting a normal urethra without strictures and a mildly obstructing prostate.  A systematic evaluation of the bladder revealed no evidence of any suspicious bladder lesions.  Ureteral orifices were in normal position.    The distal aspect of the ureteral stent was seen protruding from the left ureteral orifice.  We then used the alligator-tooth forceps and grasped the distal end of the ureteral stent and brought it out the urethral meatus while watching the proximal coil straighten out nicely on fluoroscopy. Through the ureteral stent, we then passed a 0.038 sensor wire up to the level of the renal pelvis.  The ureteral stent was then removed, leaving the sensor wire up the left ureter.    Under cystoscopic and flouroscopic guidance, we cannulated the left ureteral orifice with a 5-French open-ended ureteral catheter and a gentle retrograde pyelogram was performed, revealing a normal caliber ureter without any filling defects. There was mild hydronephrosis of the collecting system. A 0.038 sensor wire was then passed up to the level of the renal pelvis and secured to the drape as a safety wire. The ureteral catheter and cystoscope were removed, leaving the safety wire in place.   A semi-rigid ureteroscope was passed alongside the wire up the distal ureter which appeared to be slightly stenotic and stent places.  Of note, this was difficult to pass beyond the mid ureter given her body habitus and there was some minor torquing appreciated.  Thus, a second 0.038 sensor wire was passed under direct vision and the semirigid scope  was removed.  A dual-lumen and subsequently a 11/13Fr ureteral access sheath was carefully advanced up the ureter to the level of the mid ureter over this wire under fluoroscopic guidance. The flexible ureteroscope was advanced into the proximal left ureter where we encountered the stone.Stone measured about 3 x 5 mm.  There is also some clot residue present.  Using the 272 micron holmium laser fiber, the stone was fragmented completely. A 2.2 Fr zero tip basket was used to remove the fragments under visual guidance. These were sent for chemical analysis.  I then passed a ureteroscope up into the kidney.  Again, there is a clot residue present and these were basket extracted.  With the ureteroscope in the kidney, a gentle pyelogram was performed to delineate the calyceal system and we evaluated the calyces systematically. We encountered no further large stone fragments. The rest of the stone fragments were very tiny and these were  irrigated away gently. The calyces were re-inspected and there were no significant stone fragment residual.   We then withdrew the ureteroscope back down the ureter along with the access sheath, noting no evidence of any stones along the course of the ureter.  Prior to removing the ureteroscope, we did pass the Glidewire back up to the ureter to the renal pelvis.    Once the ureteroscope was removed, the Glidewire was backloaded through the rigid cystoscope, which was then advanced down the urethra and into the bladder. We then used the Glidewire under direct vision through the rigid cystoscope and under fluoroscopic guidance and passed up a 6-French, 26 cm double-pigtail ureteral stent up ureter, making sure that the proximal and distal ends coiled within the kidney and bladder respectively.  We did not leave a tether string.  The cystoscope was then advanced back into the bladder under vision.  We were able to see the distal stent coiling nicely within the bladder.  The bladder  was then emptied with irrigation solution.  The cystoscope was then removed.    The patient tolerated the procedure well and there was no complication. Patient was awoken from anesthesia and taken to the recovery room in stable condition. I was present and scrubbed for the entirety of the case.  Plan:  Patient will be discharged home.  Follow up with me in 7 to 10 days for stent removal in the office.   Matt R. Eliel Dudding MD [facility]  Pager: [phone]

## 2024-05-31 NOTE — Transfer of Care (Signed)
 Immediate Anesthesia Transfer of Care Note  Patient: Adam Norris  Procedure(s) Performed: CYSTOSCOPY/URETEROSCOPY/HOLMIUM LASER/STENT PLACEMENT (Left: Pelvis) CYSTOSCOPY, WITH RETROGRADE PYELOGRAM (Left: Pelvis)  Patient Location: PACU  Anesthesia Type:General  Level of Consciousness: awake, alert , oriented, and patient cooperative  Airway & Oxygen Therapy: Patient Spontanous Breathing and Patient connected to face mask oxygen  Post-op Assessment: Report given to RN and Post -op Vital signs reviewed and stable  Post vital signs: Reviewed and stable  Last Vitals:  Vitals Value Taken Time  BP 162/92 05/31/24 13:15  Temp    Pulse 61 05/31/24 13:15  Resp 16 05/31/24 13:15  SpO2 100 % 05/31/24 13:15  Vitals shown include unfiled device data.  Last Pain:  Vitals:   05/31/24 1043  TempSrc: Oral  PainSc:          Complications: No notable events documented.

## 2024-05-31 NOTE — Anesthesia Preprocedure Evaluation (Addendum)
 Anesthesia Evaluation  Patient identified by MRN, date of birth, ID band Patient awake    Reviewed: Allergy & Precautions, NPO status , Patient's Chart, lab work & pertinent test results  Airway Mallampati: II  TM Distance: >3 FB Neck ROM: Full    Dental  (+) Chipped,    Pulmonary Current Smoker and Patient abstained from smoking.   Pulmonary exam normal        Cardiovascular negative cardio ROS Normal cardiovascular exam     Neuro/Psych negative neurological ROS  negative psych ROS   GI/Hepatic negative GI ROS, Neg liver ROS,,,  Endo/Other  negative endocrine ROS    Renal/GU Renal disease     Musculoskeletal negative musculoskeletal ROS (+)    Abdominal   Peds  Hematology negative hematology ROS (+)   Anesthesia Other Findings LEFT URETERAL STONE  Reproductive/Obstetrics                              Anesthesia Physical Anesthesia Plan  ASA: 2  Anesthesia Plan: General   Post-op Pain Management:    Induction: Intravenous  PONV Risk Score and Plan: 1 and Ondansetron , Dexamethasone , Midazolam  and Treatment may vary due to age or medical condition  Airway Management Planned: LMA  Additional Equipment:   Intra-op Plan:   Post-operative Plan: Extubation in OR  Informed Consent: I have reviewed the patients History and Physical, chart, labs and discussed the procedure including the risks, benefits and alternatives for the proposed anesthesia with the patient or authorized representative who has indicated his/her understanding and acceptance.     Dental advisory given  Plan Discussed with: CRNA  Anesthesia Plan Comments:          Anesthesia Quick Evaluation

## 2024-06-01 ENCOUNTER — Encounter (HOSPITAL_COMMUNITY): Payer: Self-pay | Admitting: Urology
